# Patient Record
Sex: Female | Born: 1970 | Race: White | Hispanic: No | Smoking: Never smoker
Health system: Southern US, Community
[De-identification: ages and names within clinical notes are randomized; demographics above are authoritative.]

## PROBLEM LIST (undated history)

## (undated) DIAGNOSIS — U071 COVID-19: Secondary | ICD-10-CM

## (undated) DIAGNOSIS — B019 Varicella without complication: Secondary | ICD-10-CM

## (undated) DIAGNOSIS — E785 Hyperlipidemia, unspecified: Secondary | ICD-10-CM

## (undated) DIAGNOSIS — B977 Papillomavirus as the cause of diseases classified elsewhere: Secondary | ICD-10-CM

## (undated) DIAGNOSIS — G43909 Migraine, unspecified, not intractable, without status migrainosus: Secondary | ICD-10-CM

## (undated) DIAGNOSIS — R7303 Prediabetes: Secondary | ICD-10-CM

## (undated) DIAGNOSIS — I456 Pre-excitation syndrome: Secondary | ICD-10-CM

## (undated) HISTORY — DX: Hyperlipidemia, unspecified: E78.5

## (undated) HISTORY — DX: Varicella without complication: B01.9

## (undated) HISTORY — DX: Migraine, unspecified, not intractable, without status migrainosus: G43.909

## (undated) HISTORY — DX: Pre-excitation syndrome: I45.6

## (undated) HISTORY — DX: Prediabetes: R73.03

## (undated) HISTORY — DX: COVID-19: U07.1

## (undated) HISTORY — DX: Papillomavirus as the cause of diseases classified elsewhere: B97.7

---

## 2007-06-03 ENCOUNTER — Emergency Department (HOSPITAL_COMMUNITY): Admission: EM | Admit: 2007-06-03 | Discharge: 2007-06-03 | Payer: Self-pay | Admitting: Emergency Medicine

## 2007-06-09 ENCOUNTER — Emergency Department (HOSPITAL_COMMUNITY): Admission: EM | Admit: 2007-06-09 | Discharge: 2007-06-09 | Payer: Self-pay | Admitting: Family Medicine

## 2007-06-14 ENCOUNTER — Emergency Department (HOSPITAL_COMMUNITY): Admission: EM | Admit: 2007-06-14 | Discharge: 2007-06-14 | Payer: Self-pay | Admitting: Emergency Medicine

## 2007-11-29 ENCOUNTER — Emergency Department (HOSPITAL_COMMUNITY): Admission: EM | Admit: 2007-11-29 | Discharge: 2007-11-29 | Payer: Self-pay | Admitting: Emergency Medicine

## 2007-12-02 ENCOUNTER — Inpatient Hospital Stay (HOSPITAL_COMMUNITY): Admission: AD | Admit: 2007-12-02 | Discharge: 2007-12-03 | Payer: Self-pay | Admitting: Family Medicine

## 2007-12-02 ENCOUNTER — Emergency Department (HOSPITAL_COMMUNITY): Admission: EM | Admit: 2007-12-02 | Discharge: 2007-12-02 | Payer: Self-pay | Admitting: Emergency Medicine

## 2008-07-02 ENCOUNTER — Inpatient Hospital Stay (HOSPITAL_COMMUNITY): Admission: AD | Admit: 2008-07-02 | Discharge: 2008-07-04 | Payer: Self-pay | Admitting: Obstetrics and Gynecology

## 2009-03-10 ENCOUNTER — Emergency Department (HOSPITAL_COMMUNITY): Admission: EM | Admit: 2009-03-10 | Discharge: 2009-03-11 | Payer: Self-pay | Admitting: Emergency Medicine

## 2010-07-24 ENCOUNTER — Encounter: Admission: RE | Admit: 2010-07-24 | Discharge: 2010-07-24 | Payer: Self-pay | Admitting: Obstetrics and Gynecology

## 2011-01-13 LAB — RAPID STREP SCREEN (MED CTR MEBANE ONLY): Streptococcus, Group A Screen (Direct): NEGATIVE
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
## 2011-06-27 LAB — HCG, QUANTITATIVE, PREGNANCY
hCG, Beta Chain, Quant, S: 126830 — ABNORMAL HIGH
hCG, Beta Chain, Quant, S: 164325 — ABNORMAL HIGH

## 2011-06-27 LAB — WET PREP, GENITAL: Yeast Wet Prep HPF POC: NONE SEEN

## 2011-06-27 LAB — COMPREHENSIVE METABOLIC PANEL
ALT: 21
AST: 19
Albumin: 3.5
Alkaline Phosphatase: 42
BUN: 6
CO2: 24
Calcium: 9.1
Chloride: 100
Creatinine, Ser: 0.63
GFR calc Af Amer: >60
GFR calc non Af Amer: >60
Glucose, Bld: 97
Potassium: 3.3 — ABNORMAL LOW
Sodium: 134 — ABNORMAL LOW
Total Bilirubin: 0.8
Total Protein: 7.2

## 2011-06-27 LAB — POCT PREGNANCY, URINE
Operator id: 272551
Preg Test, Ur: POSITIVE

## 2011-06-27 LAB — URINALYSIS, ROUTINE W REFLEX MICROSCOPIC
Bilirubin Urine: NEGATIVE
Glucose, UA: NEGATIVE
Hgb urine dipstick: NEGATIVE
Ketones, ur: NEGATIVE
Nitrite: NEGATIVE
Protein, ur: 30 — AB
Specific Gravity, Urine: 1.025
Urobilinogen, UA: 0.2
pH: 6

## 2011-06-27 LAB — GC/CHLAMYDIA PROBE AMP, GENITAL
Chlamydia, DNA Probe: NEGATIVE
GC Probe Amp, Genital: NEGATIVE

## 2011-06-27 LAB — CBC
HCT: 34.6 — ABNORMAL LOW
Hemoglobin: 12
MCHC: 34.6
MCV: 85.4
Platelets: 283
RBC: 4.05
RDW: 13.9
WBC: 12.7 — ABNORMAL HIGH

## 2011-06-27 LAB — URINE MICROSCOPIC-ADD ON

## 2011-06-27 LAB — ABO/RH: ABO/RH(D): B POS

## 2011-06-27 LAB — URINE CULTURE: Colony Count: 35000

## 2011-07-07 LAB — CBC
HCT: 30.7 — ABNORMAL LOW
HCT: 35.2 — ABNORMAL LOW
Hemoglobin: 10.2 — ABNORMAL LOW
Hemoglobin: 11.7 — ABNORMAL LOW
MCHC: 33.2
MCHC: 33.4
MCV: 84.6
MCV: 84.9
Platelets: 204
Platelets: 237
RBC: 3.62 — ABNORMAL LOW
RBC: 4.16
RDW: 15.9 — ABNORMAL HIGH
RDW: 16.5 — ABNORMAL HIGH
WBC: 11.4 — ABNORMAL HIGH
WBC: 13 — ABNORMAL HIGH

## 2011-07-07 LAB — CCBB MATERNAL DONOR DRAW

## 2011-07-07 LAB — RPR: RPR Ser Ql: NONREACTIVE

## 2011-07-18 LAB — POCT URINALYSIS DIP (DEVICE)
Glucose, UA: NEGATIVE
Hgb urine dipstick: NEGATIVE
Nitrite: NEGATIVE
Operator id: 239701
Protein, ur: NEGATIVE
Specific Gravity, Urine: 1.02
Urobilinogen, UA: 0.2
pH: 6.5

## 2011-07-18 LAB — POCT PREGNANCY, URINE
Operator id: 239701
Preg Test, Ur: NEGATIVE

## 2011-07-18 LAB — GC/CHLAMYDIA PROBE AMP, GENITAL
Chlamydia, DNA Probe: NEGATIVE
GC Probe Amp, Genital: NEGATIVE

## 2011-07-18 LAB — WET PREP, GENITAL
Clue Cells Wet Prep HPF POC: NONE SEEN
Trich, Wet Prep: NONE SEEN

## 2011-09-04 ENCOUNTER — Other Ambulatory Visit: Payer: Self-pay | Admitting: Obstetrics and Gynecology

## 2011-09-04 DIAGNOSIS — M549 Dorsalgia, unspecified: Secondary | ICD-10-CM

## 2011-09-06 ENCOUNTER — Ambulatory Visit
Admission: RE | Admit: 2011-09-06 | Discharge: 2011-09-06 | Disposition: A | Discharge status: Home / Self Care | Payer: BC Managed Care – PPO | Source: Ambulatory Visit | Attending: Obstetrics and Gynecology | Admitting: Obstetrics and Gynecology

## 2011-09-06 DIAGNOSIS — M549 Dorsalgia, unspecified: Secondary | ICD-10-CM

## 2011-09-16 ENCOUNTER — Other Ambulatory Visit: Payer: Self-pay | Admitting: Orthopedic Surgery

## 2011-09-16 ENCOUNTER — Ambulatory Visit
Admission: RE | Admit: 2011-09-16 | Discharge: 2011-09-16 | Disposition: A | Discharge status: Home / Self Care | Payer: BC Managed Care – PPO | Source: Ambulatory Visit | Attending: Orthopedic Surgery | Admitting: Orthopedic Surgery

## 2011-09-16 DIAGNOSIS — M545 Low back pain, unspecified: Secondary | ICD-10-CM

## 2011-09-18 ENCOUNTER — Other Ambulatory Visit: Payer: Self-pay | Admitting: Orthopedic Surgery

## 2011-09-18 DIAGNOSIS — N289 Disorder of kidney and ureter, unspecified: Secondary | ICD-10-CM

## 2011-09-19 ENCOUNTER — Ambulatory Visit
Admission: RE | Admit: 2011-09-19 | Discharge: 2011-09-19 | Disposition: A | Discharge status: Home / Self Care | Payer: BC Managed Care – PPO | Source: Ambulatory Visit | Attending: Orthopedic Surgery | Admitting: Orthopedic Surgery

## 2011-09-19 DIAGNOSIS — N289 Disorder of kidney and ureter, unspecified: Secondary | ICD-10-CM

## 2011-09-19 MED ORDER — IOHEXOL 300 MG/ML  SOLN
125.0000 mL | Freq: Once | INTRAMUSCULAR | Status: AC | PRN
  Administered 2011-09-19: 125 mL via INTRAVENOUS

## 2012-02-04 ENCOUNTER — Telehealth: Payer: Self-pay | Admitting: Obstetrics and Gynecology

## 2012-02-04 NOTE — Telephone Encounter (Signed)
Routed to triage 

## 2012-02-05 NOTE — Telephone Encounter (Signed)
Lm on vm tcb rgd msg 

## 2012-02-09 NOTE — Telephone Encounter (Signed)
Spoke with pt rgd msg pt wants t

## 2012-02-09 NOTE — Telephone Encounter (Signed)
Spoke with pt rgd msg pt wants to discuss bc options pt has appt 02/24/12 at 845 with AVS pt voice understanding

## 2012-02-24 ENCOUNTER — Encounter: Payer: Self-pay | Admitting: Obstetrics and Gynecology

## 2012-03-03 ENCOUNTER — Encounter: Payer: Self-pay | Admitting: Obstetrics and Gynecology

## 2012-03-03 ENCOUNTER — Ambulatory Visit (INDEPENDENT_AMBULATORY_CARE_PROVIDER_SITE_OTHER): Payer: BC Managed Care – PPO | Admitting: Obstetrics and Gynecology

## 2012-03-03 VITALS — BP 108/80 | Resp 16 | Ht 63.0 in | Wt 237.0 lb

## 2012-03-03 DIAGNOSIS — N920 Excessive and frequent menstruation with regular cycle: Secondary | ICD-10-CM

## 2012-03-03 DIAGNOSIS — M549 Dorsalgia, unspecified: Secondary | ICD-10-CM

## 2012-03-03 DIAGNOSIS — E669 Obesity, unspecified: Secondary | ICD-10-CM

## 2012-03-03 DIAGNOSIS — Z309 Encounter for contraceptive management, unspecified: Secondary | ICD-10-CM

## 2012-03-03 DIAGNOSIS — N390 Urinary tract infection, site not specified: Secondary | ICD-10-CM

## 2012-03-03 DIAGNOSIS — IMO0001 Reserved for inherently not codable concepts without codable children: Secondary | ICD-10-CM

## 2012-03-03 LAB — POCT URINALYSIS DIPSTICK
Bilirubin, UA: NEGATIVE
Blood, UA: NEGATIVE
Glucose, UA: NEGATIVE
Ketones, UA: NEGATIVE
Nitrite, UA: NEGATIVE

## 2012-03-03 MED ORDER — NORETHINDRONE 0.35 MG PO TABS: 1.0000 | ORAL_TABLET | Freq: Every day | ORAL | Status: DC

## 2012-03-03 MED ORDER — SULFAMETHOXAZOLE-TMP DS 800-160 MG PO TABS: 1.0000 | ORAL_TABLET | Freq: Two times a day (BID) | ORAL | Status: AC

## 2012-03-03 NOTE — Progress Notes (Signed)
[name] is a 41 y.o. year old female,G2P1, who presents for a problem visit. The patient had her Mirena IUD removed because of back pain and strange neurologic symptoms that she thought was related to the Mirena.  Her back pain is somewhat better.  The neurologic symptoms continue.  Subjective:  The patient complains of urinary frequency.  She is interested in contraception that will help her periods not be so heavy.  Objective:  BP 108/80  Resp 16  Ht 5\' 3"  (1.6 m)  Wt 237 lb (107.502 kg)  BMI 41.98 kg/m2  LMP 02/19/2012   General: alert, cooperative and no distress GI: soft, non-tender; bowel sounds normal; no masses,  no organomegaly back: No CVA tenderness  pelvic exam declined  Urinalysis: 2+ leukocytes  Assessment:  Urinary frequency Contraception Obesity Heavy periods Improved back pain Neurologic symptoms of uncertain etiology  Plan:  Lungs discussion was held about contraceptive options.  The patient elects to begin oral contraceptives on a continuous basis.  Risk and benefits of contraception were reviewed.  Septra DS for urinary tract infection.  Return to office prn if symptoms worsen or fail to improve.   Leonard Schwartz M.D.  03/03/2012 9:18 AM

## 2012-03-04 ENCOUNTER — Other Ambulatory Visit: Payer: Self-pay | Admitting: Obstetrics and Gynecology

## 2012-03-04 NOTE — Telephone Encounter (Signed)
TC to pt.  regarding Rx.  Per pharmacy RX is available.

## 2012-03-04 NOTE — Telephone Encounter (Signed)
Triage/epic 

## 2012-09-15 ENCOUNTER — Other Ambulatory Visit: Payer: Self-pay | Admitting: Obstetrics and Gynecology

## 2012-09-15 DIAGNOSIS — Z1231 Encounter for screening mammogram for malignant neoplasm of breast: Secondary | ICD-10-CM

## 2012-10-07 ENCOUNTER — Ambulatory Visit
Admission: RE | Admit: 2012-10-07 | Discharge: 2012-10-07 | Disposition: A | Discharge status: Home / Self Care | Payer: BC Managed Care – PPO | Source: Ambulatory Visit | Attending: Obstetrics and Gynecology | Admitting: Obstetrics and Gynecology

## 2012-10-07 DIAGNOSIS — Z1231 Encounter for screening mammogram for malignant neoplasm of breast: Secondary | ICD-10-CM

## 2012-10-25 ENCOUNTER — Encounter: Payer: Self-pay | Admitting: Obstetrics and Gynecology

## 2012-10-25 ENCOUNTER — Ambulatory Visit: Payer: BC Managed Care – PPO | Admitting: Obstetrics and Gynecology

## 2012-10-25 VITALS — BP 110/70 | HR 76 | Resp 16 | Ht 63.0 in | Wt 247.0 lb

## 2012-10-25 DIAGNOSIS — Z01419 Encounter for gynecological examination (general) (routine) without abnormal findings: Secondary | ICD-10-CM

## 2012-10-25 DIAGNOSIS — N920 Excessive and frequent menstruation with regular cycle: Secondary | ICD-10-CM

## 2012-10-25 DIAGNOSIS — Z124 Encounter for screening for malignant neoplasm of cervix: Secondary | ICD-10-CM

## 2012-10-25 MED ORDER — NORETHINDRONE 0.35 MG PO TABS: 1.0000 | ORAL_TABLET | Freq: Every day | ORAL | Status: DC

## 2012-10-25 NOTE — Progress Notes (Signed)
Regular Periods: no "Every four 4 months" Mammogram: yes  Monthly Breast Ex.: no Exercise: yes  Tetanus < 10 years: no Seatbelts: yes  NI. Bladder Functn.: no "Frequency" No pain.  Abuse at home: no  Daily BM's: yes Stressful Work: yes  Healthy Diet: yes Sigmoid-Colonoscopy: N/A  Calcium: no Medical problems this year: None per pt    LAST PAP: 06/2011 "WNL"   Contraception: Camilla   Mammogram:  10/07/2012 "WNL"  PCP: No PCP  PMH: No Changes  FMH: No Changes  Last Bone Scan: No Changes

## 2012-10-25 NOTE — Progress Notes (Signed)
Subjective:    Candice Oconnor is a 42 y.o. female, G2P1, who presents for an annual exam. The patient reports no complaints.  Menstrual cycle:   LMP: none  on Camila              Review of Systems Pertinent items are noted in HPI. Denies pelvic pain, urinary tract symptoms, vaginitis symptoms, irregular bleeding, menopausal symptoms, change in bowel habits or rectal bleeding   Objective:    BP 110/70  Pulse 76  Resp 16  Ht 5\' 3"  (1.6 m)  Wt 247 lb (112.038 kg)  BMI 43.75 kg/m2  LMP 10/21/2012   Wt Readings from Last 1 Encounters:  03/03/12 237 lb (107.502 kg)   There is no height or weight on file to calculate BMI. General Appearance: Alert, no acute distress HEENT: Grossly normal Neck / Thyroid: Supple, no thyromegaly or cervical adenopathy Lungs: Clear to auscultation bilaterally Back: No CVA tenderness Breast Exam: No masses or nodes.No dimpling, nipple retraction or discharge. Cardiovascular: Regular rate and rhythm.  Gastrointestinal: Soft, non-tender, no masses or organomegaly Pelvic Exam: EGBUS-wnl, vagina-small blood, normal rugae, cervix- without lesions or tenderness, uterus appears normal size shape and consistency, exam limited by habitus, adnexae-no masses or tenderness Rectovaginal: sentinel tag  and normal sphincter tone Lymphatic Exam: Non-palpable nodes in neck, clavicular,  axillary, or inguinal regions  Skin: no rashes or abnormalities Extremities: no clubbing cyanosis or edema  Neurologic: grossly normal Psychiatric: Alert and oriented  Assessment:   Routine GYN Exam   Plan:  Camila  #1 1 po qd 11 refills  Now using [email or web address] for weight management, encouraged to continue   PAP sent  RTO 1 year or prn  Demiah Gullickson,ELMIRAPA-C

## 2012-10-26 LAB — PAP IG W/ RFLX HPV ASCU

## 2012-10-28 ENCOUNTER — Encounter: Payer: Self-pay | Admitting: Obstetrics and Gynecology

## 2013-03-16 ENCOUNTER — Encounter (HOSPITAL_BASED_OUTPATIENT_CLINIC_OR_DEPARTMENT_OTHER): Payer: Self-pay | Admitting: *Deleted

## 2013-03-16 ENCOUNTER — Emergency Department (HOSPITAL_BASED_OUTPATIENT_CLINIC_OR_DEPARTMENT_OTHER)
Admission: EM | Admit: 2013-03-16 | Discharge: 2013-03-16 | Disposition: A | Discharge status: Home / Self Care | Payer: BC Managed Care – PPO | Attending: Emergency Medicine | Admitting: Emergency Medicine

## 2013-03-16 ENCOUNTER — Emergency Department (HOSPITAL_BASED_OUTPATIENT_CLINIC_OR_DEPARTMENT_OTHER): Payer: BC Managed Care – PPO

## 2013-03-16 DIAGNOSIS — M5430 Sciatica, unspecified side: Secondary | ICD-10-CM | POA: Insufficient documentation

## 2013-03-16 DIAGNOSIS — K08109 Complete loss of teeth, unspecified cause, unspecified class: Secondary | ICD-10-CM | POA: Insufficient documentation

## 2013-03-16 DIAGNOSIS — Z79899 Other long term (current) drug therapy: Secondary | ICD-10-CM | POA: Insufficient documentation

## 2013-03-16 DIAGNOSIS — Z8619 Personal history of other infectious and parasitic diseases: Secondary | ICD-10-CM | POA: Insufficient documentation

## 2013-03-16 DIAGNOSIS — Z87440 Personal history of urinary (tract) infections: Secondary | ICD-10-CM | POA: Insufficient documentation

## 2013-03-16 DIAGNOSIS — R209 Unspecified disturbances of skin sensation: Secondary | ICD-10-CM | POA: Insufficient documentation

## 2013-03-16 DIAGNOSIS — M5432 Sciatica, left side: Secondary | ICD-10-CM

## 2013-03-16 DIAGNOSIS — M25559 Pain in unspecified hip: Secondary | ICD-10-CM | POA: Insufficient documentation

## 2013-03-16 MED ORDER — CYCLOBENZAPRINE HCL 5 MG PO TABS: 5.0000 mg | ORAL_TABLET | Freq: Three times a day (TID) | ORAL | Status: DC | PRN

## 2013-03-16 MED ORDER — IBUPROFEN 800 MG PO TABS: 800.0000 mg | ORAL_TABLET | Freq: Three times a day (TID) | ORAL | Status: DC

## 2013-03-16 MED ORDER — IBUPROFEN 800 MG PO TABS
800.0000 mg | ORAL_TABLET | Freq: Once | ORAL | Status: AC
  Administered 2013-03-16: 800 mg via ORAL
  Filled 2013-03-16: qty 1

## 2013-03-16 MED ORDER — OXYCODONE-ACETAMINOPHEN 5-325 MG PO TABS: 1.0000 | ORAL_TABLET | Freq: Four times a day (QID) | ORAL | Status: DC | PRN

## 2013-03-16 NOTE — ED Notes (Signed)
Pt reports  Sudden onset of left hip pain awakening her from sleep last night. Denies any injury or trauma, states "it's almost like a sciatic nerve kind of thing..." pt reports intermittent numbness that radiates from her hip to her foot on the left.

## 2013-03-16 NOTE — ED Provider Notes (Signed)
History     CSN: [id number]  Arrival date & time 03/16/13  4098   First MD Initiated Contact with Patient 03/16/13 0720      Chief Complaint  Patient presents with  . Leg Pain    (Consider location/radiation/quality/duration/timing/severity/associated sxs/prior treatment) HPI Pt presenting with pain in posterior of left hip and buttock with radiation down left leg, also some numbness of left leg depending on what position she is in.  Pain worse with movement and palpation.  No weakness of leg, no urinary retention or incontinence of bowel or bladder.  No fever.  No new trauma.  Pain began last night while moving around in bed.  She has not tried anything for her symptoms prior to arrival.  No new activities.  There are no other associated systemic symptoms, there are no other alleviating or modifying factors.   Past Medical History  Diagnosis Date  . Low back pain   . H/O urinary tract infection   . HPV in female   . Headache(784.0)   . Mumps   . Chicken pox   . H/O wisdom tooth extraction     History reviewed. No pertinent past surgical history.  Family History  Problem Relation Age of Onset  . Heart disease Mother   . Heart disease Father     History  Substance Use Topics  . Smoking status: Never Smoker   . Smokeless tobacco: Never Used  . Alcohol Use: No    OB History   Grav Para Term Preterm Abortions TAB SAB Ect Mult Living   2 1              Review of Systems ROS reviewed and all otherwise negative except for mentioned in HPI  Allergies  Review of patient's allergies indicates no known allergies.  Home Medications   Current Outpatient Rx  Name  Route  Sig  Dispense  Refill  . cetirizine (ZYRTEC) 10 MG tablet   Oral   Take 10 mg by mouth daily.         . citalopram (CELEXA) 10 MG tablet   Oral   Take 10 mg by mouth daily.         . propantheline (PROBANTHINE) 15 MG tablet   Oral   Take 10 mg by mouth every morning.         . traZODone  (DESYREL) 50 MG tablet   Oral   Take 50 mg by mouth at bedtime.         . cyclobenzaprine (FLEXERIL) 5 MG tablet   Oral   Take 1 tablet (5 mg total) by mouth 3 (three) times daily as needed for muscle spasms.   20 tablet   0   . famotidine (PEPCID) 20 MG tablet   Oral   Take 20 mg by mouth 2 (two) times daily.         Marland Kitchen ibuprofen (ADVIL,MOTRIN) 200 MG tablet   Oral   Take 200 mg by mouth every 6 (six) hours as needed.         Marland Kitchen ibuprofen (ADVIL,MOTRIN) 800 MG tablet   Oral   Take 1 tablet (800 mg total) by mouth 3 (three) times daily.   21 tablet   0   . levonorgestrel (MIRENA) 20 MCG/24HR IUD   Intrauterine   1 each by Intrauterine route once.         . norethindrone (ORTHO MICRONOR) 0.35 MG tablet   Oral   Take 1 tablet (0.35  mg total) by mouth daily.   1 Package   11   . oxyCODONE-acetaminophen (PERCOCET/ROXICET) 5-325 MG per tablet   Oral   Take 1-2 tablets by mouth every 6 (six) hours as needed for pain.   15 tablet   0     BP 128/82  Pulse 82  Temp(Src) 97.9 F (36.6 C) (Oral)  Resp 18  Ht 5\' 3"  (1.6 m)  Wt 250 lb (113.399 kg)  BMI 44.3 kg/m2  SpO2 100% Vitals reviewed Physical Exam Physical Examination: General appearance - alert, well appearing, and in no distress Mental status - alert, oriented to person, place, and time Eyes - no scleral icterus Neck - supple, no significant adenopathy, no midline tenderness to palpation Chest - clear to auscultation, no wheezes, rales or rhonchi, symmetric air entry Heart - normal rate, regular rhythm, normal S1, S2, no murmurs, rubs, clicks or gallops Back exam - no midline tenderness to palpation for cervical, thoracic, lumbar regions, no CVA tenderness, ttp over sciatic notch of left buttock Neurological - alert, oriented, normal speech, strength 5/5 in extremities, sensation intact in extremiteis x 4 Musculoskeletal - no joint tenderness, deformity or swelling, some pain with external rotation of  left hip Extremities - peripheral pulses normal, no pedal edema, no clubbing or cyanosis Skin - normal coloration and turgor, no rashes  ED Course  Procedures (including critical care time)  Labs Reviewed - No data to display Dg Hip Complete Left  03/16/2013   *RADIOLOGY REPORT*  Clinical Data: Left hip and leg pain, no trauma  LEFT HIP - COMPLETE 2+ VIEW  Comparison: None.  Findings: The hip joint spaces are relatively normal for age.  No acute bony abnormality is seen.  The pelvic rami are intact, and the SI joints appear normal.  IMPRESSION: Negative.   Original Report Authenticated By: Dwyane Dee, M.D.     1. Sciatica, left       MDM  Pt presenting with pain in posterior hip and buttock with pain going down left leg and some numbness c/w sciatica.  No signs or symptoms of cauda equina, epidural abscess or other acute emergent problem at this time.  xrays reassuring.  All results d/w patient at the bedside. Discharged with strict return precautions.  Pt agreeable with plan.        Ethelda Chick, MD 03/16/13 [phone]

## 2014-03-08 ENCOUNTER — Other Ambulatory Visit: Payer: Self-pay | Admitting: Pain Medicine

## 2014-03-08 DIAGNOSIS — M542 Cervicalgia: Secondary | ICD-10-CM

## 2014-03-14 ENCOUNTER — Other Ambulatory Visit: Payer: BC Managed Care – PPO

## 2014-06-21 ENCOUNTER — Ambulatory Visit (INDEPENDENT_AMBULATORY_CARE_PROVIDER_SITE_OTHER): Payer: BC Managed Care – PPO

## 2014-06-21 VITALS — BP 143/86 | HR 89 | Resp 14 | Ht 63.0 in | Wt 265.0 lb

## 2014-06-21 DIAGNOSIS — S99921A Unspecified injury of right foot, initial encounter: Secondary | ICD-10-CM

## 2014-06-21 DIAGNOSIS — S99929A Unspecified injury of unspecified foot, initial encounter: Secondary | ICD-10-CM

## 2014-06-21 DIAGNOSIS — S99919A Unspecified injury of unspecified ankle, initial encounter: Secondary | ICD-10-CM

## 2014-06-21 DIAGNOSIS — S8990XA Unspecified injury of unspecified lower leg, initial encounter: Secondary | ICD-10-CM

## 2014-06-21 DIAGNOSIS — M722 Plantar fascial fibromatosis: Secondary | ICD-10-CM

## 2014-06-21 DIAGNOSIS — M84376A Stress fracture, unspecified foot, initial encounter for fracture: Secondary | ICD-10-CM

## 2014-06-21 DIAGNOSIS — M79609 Pain in unspecified limb: Secondary | ICD-10-CM

## 2014-06-21 DIAGNOSIS — M79671 Pain in right foot: Secondary | ICD-10-CM

## 2014-06-21 NOTE — Progress Notes (Signed)
   Subjective:    Patient ID: Candice Oconnor, female    DOB: 1970/10/21, 43 y.o.   MRN: [id number]  HPI Comments: N foot injury L right dorsi-midfoot D yesterday O casual walking C pt states she heard a pop, now has dull pain constant and shooting throbbing pain while walking A constant and sharp with walking T pt is currently on Flector patch for back pain  Foot Pain Associated symptoms include arthralgias and headaches.      Review of Systems  Gastrointestinal:       Nausea and stomach pain with NSAIDS  Musculoskeletal: Positive for arthralgias, back pain and gait problem.  Neurological: Positive for headaches.  All other systems reviewed and are negative.      Objective:   Physical Exam 43 year old female well-developed well-nourished severely overweight oriented x3 presents at this time with an injury to her right foot she has been wearing shoes are somewhat worn need replacing however while walking and they hurt it popped and since that time had pain in her right foot dorsal foot pointing along the second metatarsal third metatarsal midshaft area. There is mild edema noted although patient has general edema both lower extremities. Her extremity objective findings vascular status is intact pedal pulses palpable DP postal for PT posterior were for mild edema neurologically epicritic and proprioceptive sensations intact and symmetric bilateral there is normal plantar response and DTRs. Dermatological skin color pigment normal hair growth absent nails unremarkable orthopedic exam there is no pain on palpation of the left foot good range of motion to flexion all digits there is tenderness in the plantar fascia and mid arch. Right foot however has plantar fascial pain however most significant is pain on palpation over the second and third metatarsal area dorsally pain and vibratory sensation testing over the second met most likely midshaft area. X-rays reveal no acute displacement or  fracture however cannot rule out in nondisplaced stress fracture of the second metatarsal right foot. Remainder of exam unremarkable except for the fasciitis       Assessment & Plan:  Assessment this time mild plantar fasciitis bilateral or acute stress fracture second metatarsal right foot with edema pain tenderness discomfort patient's oriented flexor patch for her back we use Tylenol for a digital breakthrough pain also recommended warm compress ice pack to the area patient placed in the air fracture boot at this time for mobilization to maintain boot for compression mobilization of the right foot he removed for driving only and then resume the use keep it on at all times as a cast immobilizer. Recheck in 3 or 4 weeks for reevaluation and followup x-rays to assess healing and progress next  Harriet Masson DPM

## 2014-06-21 NOTE — Patient Instructions (Signed)
Metatarsal Stress Fracture When too much stress is put on the foot, as in running and jumping sports, the center shaft of the bones of the forefoot is very susceptible to stress fractures (break in bone). This is because of repetitive stress on the bone. This injury is more common if osteoporosis is present or if inadequate running shoes are used. Rapid increase in running distances are often the cause. Running distances should be gradually increased to avoid this problem. Shoes should be used which adequately cushion the foot. Shoes should absorb the shocks of the activity.  DIAGNOSIS  Usually the diagnosis is made by history. The foot progressively becomes sorer with activities. X-rays may be negative (show no break) within the first 2 to 3 weeks of the beginning of pain. A later X-ray may show signs of healing bone (callus formation). A bone scan or MRI will usually make the diagnosis earlier. TREATMENT AND HOME CARE INSTRUCTIONS  Treatment may or may not include a cast, removable fracture boot, or walking shoe. Casts are used for short periods of time to prevent muscle atrophy (muscle wasting).  Activities should be stopped until further advised by your caregiver.  Wear shoes with adequate shock absorbing abilities.  Alternative exercise may be undertaken while waiting for healing. These may include bicycling and swimming, or as your caregiver suggests. If you do not have a cast or splint:  You may walk on your injured foot as tolerated or advised.  Do not put any weight on your injured foot for as long as directed by your caregiver. Slowly increase the amount of time you walk on the foot as the pain allows or as advised.  Use crutches until you can bear weight without pain. A gradual increase in weight bearing may help.  Apply ice to the injury for 15-20 minutes each hour while awake for the first 2 days. Put the ice in a plastic bag and place a towel between the bag of ice and your  skin.  Only take over-the-counter or prescription medicines for pain, discomfort, or fever as directed by your caregiver. SEEK IMMEDIATE MEDICAL CARE IF:   Pain is becoming worse rather than better, or if pain is uncontrolled with medications.  You have increased swelling or redness in the foot. MAKE SURE YOU:   Understand these instructions.  Will watch your condition.  Will get help right away if you are not doing well or get worse. Document Released: 09/19/2000 Document Revised: 12/15/2011 Document Reviewed: 07/18/2008 Exit[employer] Patient Information 2015 [employer]. This information is not intended to replace advice given to you by your health care provider. Make sure you discuss any questions you have with your health care provider.  

## 2014-07-12 ENCOUNTER — Telehealth: Payer: Self-pay | Admitting: *Deleted

## 2014-07-12 NOTE — Telephone Encounter (Signed)
I see Dr. Blenda Mounts.  I have water physical therapy scheduled for arthritis in my back but I have a hairline fracture in the 2nd metatarsal bone in my foot.  I'm just calling to make sure that's okay for me to be walking around in the pool.  Thank you.

## 2014-07-13 NOTE — Telephone Encounter (Signed)
I called and left her a message that it is okay to do the water exercise per Dr. Blenda Mounts.  It said it may actually be beneficial for bone healing.  Wear the boot/shoe until you get in the water.  Wear a pool shoe or water type shoe while in the pool.  Call if you have any further questions.

## 2014-07-13 NOTE — Telephone Encounter (Signed)
Walking in the pool is not as much stress as walking on Land.. maintain the boot her shoe until you get in the water once the water you are weight lists or less weight or pressure on your foot as long as you're in more than 4 feet of water. We suggest using a pool shoe or water type shoes made beneficial as well during water exercise activity. Actually the water activity may be beneficial to the fracture healing next  Harriet Masson DPM

## 2014-07-18 ENCOUNTER — Ambulatory Visit: Payer: BC Managed Care – PPO

## 2014-08-01 ENCOUNTER — Ambulatory Visit: Payer: BC Managed Care – PPO

## 2014-09-14 ENCOUNTER — Ambulatory Visit
Admission: RE | Admit: 2014-09-14 | Discharge: 2014-09-14 | Disposition: A | Discharge status: Home / Self Care | Payer: BC Managed Care – PPO | Source: Ambulatory Visit | Attending: Physician Assistant | Admitting: Physician Assistant

## 2014-09-14 ENCOUNTER — Other Ambulatory Visit: Payer: Self-pay | Admitting: Physician Assistant

## 2014-09-14 DIAGNOSIS — R112 Nausea with vomiting, unspecified: Secondary | ICD-10-CM

## 2014-09-14 DIAGNOSIS — R1084 Generalized abdominal pain: Secondary | ICD-10-CM

## 2014-09-14 DIAGNOSIS — R140 Abdominal distension (gaseous): Secondary | ICD-10-CM

## 2014-09-14 DIAGNOSIS — D72829 Elevated white blood cell count, unspecified: Secondary | ICD-10-CM

## 2014-09-15 ENCOUNTER — Emergency Department (HOSPITAL_COMMUNITY)
Admission: EM | Admit: 2014-09-15 | Discharge: 2014-09-15 | Disposition: A | Discharge status: Home / Self Care | Payer: BC Managed Care – PPO | Attending: Emergency Medicine | Admitting: Emergency Medicine

## 2014-09-15 ENCOUNTER — Encounter (HOSPITAL_COMMUNITY): Payer: Self-pay | Admitting: *Deleted

## 2014-09-15 ENCOUNTER — Emergency Department (HOSPITAL_COMMUNITY): Payer: BC Managed Care – PPO

## 2014-09-15 DIAGNOSIS — R1032 Left lower quadrant pain: Secondary | ICD-10-CM | POA: Insufficient documentation

## 2014-09-15 DIAGNOSIS — Z791 Long term (current) use of non-steroidal anti-inflammatories (NSAID): Secondary | ICD-10-CM | POA: Insufficient documentation

## 2014-09-15 DIAGNOSIS — R109 Unspecified abdominal pain: Secondary | ICD-10-CM

## 2014-09-15 DIAGNOSIS — R112 Nausea with vomiting, unspecified: Secondary | ICD-10-CM | POA: Diagnosis not present

## 2014-09-15 DIAGNOSIS — R1013 Epigastric pain: Secondary | ICD-10-CM | POA: Diagnosis not present

## 2014-09-15 DIAGNOSIS — Z8619 Personal history of other infectious and parasitic diseases: Secondary | ICD-10-CM | POA: Insufficient documentation

## 2014-09-15 DIAGNOSIS — Z8739 Personal history of other diseases of the musculoskeletal system and connective tissue: Secondary | ICD-10-CM | POA: Diagnosis not present

## 2014-09-15 DIAGNOSIS — R102 Pelvic and perineal pain: Secondary | ICD-10-CM | POA: Diagnosis not present

## 2014-09-15 DIAGNOSIS — R1012 Left upper quadrant pain: Secondary | ICD-10-CM | POA: Diagnosis not present

## 2014-09-15 DIAGNOSIS — Z87440 Personal history of urinary (tract) infections: Secondary | ICD-10-CM | POA: Insufficient documentation

## 2014-09-15 DIAGNOSIS — Z8719 Personal history of other diseases of the digestive system: Secondary | ICD-10-CM | POA: Insufficient documentation

## 2014-09-15 DIAGNOSIS — Z79899 Other long term (current) drug therapy: Secondary | ICD-10-CM | POA: Insufficient documentation

## 2014-09-15 DIAGNOSIS — R197 Diarrhea, unspecified: Secondary | ICD-10-CM | POA: Insufficient documentation

## 2014-09-15 DIAGNOSIS — R110 Nausea: Secondary | ICD-10-CM

## 2014-09-15 DIAGNOSIS — E669 Obesity, unspecified: Secondary | ICD-10-CM | POA: Insufficient documentation

## 2014-09-15 LAB — I-STAT BETA HCG BLOOD, ED (MC, WL, AP ONLY)

## 2014-09-15 LAB — COMPREHENSIVE METABOLIC PANEL
ALBUMIN: 3.7 g/dL (ref 3.5–5.2)
ALK PHOS: 78 U/L (ref 39–117)
ALT: 15 U/L (ref 0–35)
ANION GAP: 15 (ref 5–15)
AST: 13 U/L (ref 0–37)
BUN: 8 mg/dL (ref 6–23)
CALCIUM: 9.4 mg/dL (ref 8.4–10.5)
CO2: 23 mEq/L (ref 19–32)
Chloride: 102 mEq/L (ref 96–112)
Creatinine, Ser: 0.68 mg/dL (ref 0.50–1.10)
GFR calc non Af Amer: >90 mL/min (ref >90)
Glucose, Bld: 101 mg/dL — ABNORMAL HIGH (ref 70–99)
POTASSIUM: 4.2 meq/L (ref 3.7–5.3)
SODIUM: 140 meq/L (ref 137–147)
TOTAL PROTEIN: 7.7 g/dL (ref 6.0–8.3)
Total Bilirubin: 0.2 mg/dL — ABNORMAL LOW (ref 0.3–1.2)

## 2014-09-15 LAB — CBC WITH DIFFERENTIAL/PLATELET
BASOS ABS: 0 10*3/uL (ref 0.0–0.1)
Basophils Relative: 0 % (ref 0–1)
Eosinophils Absolute: 0.4 10*3/uL (ref 0.0–0.7)
Eosinophils Relative: 4 % (ref 0–5)
HCT: 38.7 % (ref 36.0–46.0)
Hemoglobin: 12.8 g/dL (ref 12.0–15.0)
LYMPHS ABS: 1.5 10*3/uL (ref 0.7–4.0)
LYMPHS PCT: 15 % (ref 12–46)
MCH: 27.8 pg (ref 26.0–34.0)
MCHC: 33.1 g/dL (ref 30.0–36.0)
MCV: 83.9 fL (ref 78.0–100.0)
Monocytes Absolute: 0.6 10*3/uL (ref 0.1–1.0)
Monocytes Relative: 6 % (ref 3–12)
NEUTROS ABS: 7.8 10*3/uL — AB (ref 1.7–7.7)
NEUTROS PCT: 75 % (ref 43–77)
PLATELETS: 266 10*3/uL (ref 150–400)
RBC: 4.61 MIL/uL (ref 3.87–5.11)
RDW: 13.8 % (ref 11.5–15.5)
WBC: 10.3 10*3/uL (ref 4.0–10.5)

## 2014-09-15 LAB — URINE MICROSCOPIC-ADD ON

## 2014-09-15 LAB — URINALYSIS, ROUTINE W REFLEX MICROSCOPIC
Bilirubin Urine: NEGATIVE
GLUCOSE, UA: NEGATIVE mg/dL
Hgb urine dipstick: NEGATIVE
Ketones, ur: NEGATIVE mg/dL
NITRITE: NEGATIVE
PH: 6.5 (ref 5.0–8.0)
Protein, ur: NEGATIVE mg/dL
SPECIFIC GRAVITY, URINE: 1.016 (ref 1.005–1.030)
Urobilinogen, UA: 0.2 mg/dL (ref 0.0–1.0)

## 2014-09-15 LAB — LIPASE, BLOOD: Lipase: 33 U/L (ref 11–59)

## 2014-09-15 MED ORDER — MORPHINE SULFATE 4 MG/ML IJ SOLN
4.0000 mg | Freq: Once | INTRAMUSCULAR | Status: AC
  Administered 2014-09-15: 4 mg via INTRAVENOUS
  Filled 2014-09-15: qty 1

## 2014-09-15 MED ORDER — IOHEXOL 300 MG/ML  SOLN
100.0000 mL | Freq: Once | INTRAMUSCULAR | Status: AC | PRN
  Administered 2014-09-15: 100 mL via INTRAVENOUS

## 2014-09-15 MED ORDER — PROMETHAZINE HCL 25 MG/ML IJ SOLN
12.5000 mg | Freq: Once | INTRAMUSCULAR | Status: AC
  Administered 2014-09-15: 12.5 mg via INTRAVENOUS
  Filled 2014-09-15: qty 1

## 2014-09-15 MED ORDER — HYDROCODONE-ACETAMINOPHEN 5-325 MG PO TABS: 1.0000 | ORAL_TABLET | Freq: Four times a day (QID) | ORAL | Status: DC | PRN

## 2014-09-15 MED ORDER — SODIUM CHLORIDE 0.9 % IV BOLUS (SEPSIS)
1000.0000 mL | Freq: Once | INTRAVENOUS | Status: AC
  Administered 2014-09-15: 1000 mL via INTRAVENOUS

## 2014-09-15 MED ORDER — ONDANSETRON HCL 4 MG PO TABS: 4.0000 mg | ORAL_TABLET | Freq: Three times a day (TID) | ORAL | Status: DC | PRN

## 2014-09-15 MED ORDER — ONDANSETRON HCL 4 MG/2ML IJ SOLN
4.0000 mg | Freq: Once | INTRAMUSCULAR | Status: AC
  Administered 2014-09-15: 4 mg via INTRAVENOUS
  Filled 2014-09-15: qty 2

## 2014-09-15 MED ORDER — IOHEXOL 300 MG/ML  SOLN
25.0000 mL | INTRAMUSCULAR | Status: DC | PRN
  Administered 2014-09-15: 25 mL via ORAL
  Filled 2014-09-15: qty 30

## 2014-09-15 NOTE — Discharge Instructions (Signed)
Your labs and CT are reassuring, no explanation for your symptoms on CT exam. Follow-up with your GI specialist, gynecologist for further evaluation of your ongoing symptoms. Drink plenty of fluids. Return if symptoms worsen.

## 2014-09-15 NOTE — ED Notes (Signed)
Pt to ED c/o increased abdominal pain, diarrhea and nausea, despite being on phenergan.  Pt has been seen by her OBGYN and GI for initial pelvic pain.  Pain now has "moved up" to abdomen, esp L side.  Pt states emesis x 2 yesterday and diarrhea x 2 today.

## 2014-09-15 NOTE — ED Provider Notes (Signed)
43 year old female, recently worked up by gynecology with pelvic ultrasound, negative, worked up by gastroenterology for H. pylori, CT scan ordered for later next week, presents with increased pain in the left upper quadrant and epigastrium, ongoing episodes of vomiting. Has been prescribed Phenergan and a pain medication by the gynecologist. On exam the patient has tenderness in the epigastrium and left upper quadrant, no guarding, very soft abdomen, no peritoneal signs, no tachycardia and normal lung sounds. We'll proceed with CT scan, labs are unremarkable including no leukocytosis or liver function abnormalities, normal lipase. Patient is in agreement with the plan. Anticipate treatment for her symptoms consistent with IBS if workup negative.  Medical screening examination/treatment/procedure(s) were conducted as a shared visit with non-physician practitioner(s) and myself.  I personally evaluated the patient during the encounter.  Clinical Impression:   Final diagnoses:  Epigastric abdominal pain  Abdominal discomfort  Nausea in adult  Diarrhea         Johnna Acosta, MD 09/16/14 [phone]

## 2014-09-15 NOTE — ED Notes (Signed)
Pt transported to CT ?

## 2014-09-15 NOTE — ED Provider Notes (Signed)
CSN: [id number]     Arrival date & time 09/15/14  4268 History   First MD Initiated Contact with Patient 09/15/14 0750     No chief complaint on file.    (Consider location/radiation/quality/duration/timing/severity/associated sxs/prior Treatment) HPI Comments: The patient is a 43 year old female with past medical history of low back pain, obesity, presenting to emergency room chief complaint of abdominal discomfort for several weeks. Patient reports initial discomfort as pelvic pain, seen by gyncologist with increase in discomfort after pelvic exam. Negative ultrasound. Patient reports increase in epigastric and left-sided abdominal discomfort over the last several days. She reports 2 episodes of vomiting the last 24 hours, unrelieved by phenergan. She reports intermittent diarrhea over the last 3 weeks. Patient's last menstrual period was 08/24/2014. Reports irregular menstrual periods over the past several months, reports spotting.  GI: [facility]  The history is provided by the patient. No language interpreter was used.    Past Medical History  Diagnosis Date  . Low back pain   . H/O urinary tract infection   . HPV in female   . Headache(784.0)   . Mumps   . Chicken pox   . H/O wisdom tooth extraction    No past surgical history on file. Family History  Problem Relation Age of Onset  . Heart disease Mother   . Heart disease Father    History  Substance Use Topics  . Smoking status: Never Smoker   . Smokeless tobacco: Never Used  . Alcohol Use: No   OB History    Gravida Para Term Preterm AB TAB SAB Ectopic Multiple Living   2 1             Review of Systems  Constitutional: Negative for fever and chills.  Gastrointestinal: Positive for nausea, vomiting, abdominal pain and diarrhea. Negative for constipation, blood in stool and anal bleeding.  Genitourinary: Positive for menstrual problem and pelvic pain. Negative for dysuria, urgency, hematuria and vaginal discharge.       Allergies  Other  Home Medications   Prior to Admission medications   Medication Sig Start Date End Date Taking? Authorizing Provider  citalopram (CELEXA) 10 MG tablet Take 40 mg by mouth daily.     Historical Provider, MD  diclofenac (FLECTOR) 1.3 % PTCH Place 1 patch onto the skin 2 (two) times daily.    Historical Provider, MD  famotidine (PEPCID) 20 MG tablet Take 20 mg by mouth 2 (two) times daily.    Historical Provider, MD  norethindrone (ORTHO MICRONOR) 0.35 MG tablet Take 1 tablet (0.35 mg total) by mouth daily. 10/25/12 10/25/13  Earnstine Regal, PA-C  propranolol (INDERAL) 40 MG tablet Take 40 mg by mouth at bedtime.    Historical Provider, MD  traZODone (DESYREL) 50 MG tablet Take 50 mg by mouth at bedtime.    Historical Provider, MD   There were no vitals taken for this visit. Physical Exam  Constitutional: She is oriented to person, place, and time. She appears well-developed and well-nourished. No distress.  Obese female  HENT:  Head: Normocephalic and atraumatic.  Neck: Neck supple.  Cardiovascular: Normal rate and regular rhythm.   Pulmonary/Chest: Effort normal. No respiratory distress.  Abdominal: Soft. She exhibits no distension. There is tenderness in the epigastric area, left upper quadrant and left lower quadrant. There is no rebound, no guarding and no CVA tenderness.  Neurological: She is alert and oriented to person, place, and time.  Skin: Skin is warm and dry. She is not diaphoretic.  Psychiatric: She has a normal mood and affect. Her behavior is normal.  Nursing note and vitals reviewed.   ED Course  Procedures (including critical care time) Labs Review Labs Reviewed  CBC WITH DIFFERENTIAL - Abnormal; Notable for the following:    Neutro Abs 7.8 (*)    All other components within normal limits  URINALYSIS, ROUTINE W REFLEX MICROSCOPIC - Abnormal; Notable for the following:    APPearance CLOUDY (*)    Leukocytes, UA TRACE (*)    All other  components within normal limits  COMPREHENSIVE METABOLIC PANEL - Abnormal; Notable for the following:    Glucose, Bld 101 (*)    Total Bilirubin 0.2 (*)    All other components within normal limits  URINE MICROSCOPIC-ADD ON - Abnormal; Notable for the following:    Squamous Epithelial / LPF MANY (*)    Bacteria, UA MANY (*)    All other components within normal limits  LIPASE, BLOOD  I-STAT BETA HCG BLOOD, ED ([facility], [facility], [facility] ONLY)    Imaging Review Dg Abd 1 View  09/14/2014   CLINICAL DATA:  Pain.  Nausea.  EXAM: ABDOMEN - 1 VIEW  COMPARISON:  CT 09/19/2011.  FINDINGS: Soft tissues unremarkable. The gas pattern is nonspecific. Pelvic calcifications noted consistent phleboliths. No acute bony abnormality.  IMPRESSION: No acute abnormality identified.  No bowel distention.   Electronically Signed   By: Marcello Moores  Register   On: 09/14/2014 10:38   Ct Abdomen Pelvis W Contrast  09/15/2014   CLINICAL DATA:  Epigastric abdominal pain.  EXAM: CT ABDOMEN AND PELVIS WITH CONTRAST  TECHNIQUE: Multidetector CT imaging of the abdomen and pelvis was performed using the standard protocol following bolus administration of intravenous contrast.  CONTRAST:  170mL OMNIPAQUE IOHEXOL 300 MG/ML  SOLN  COMPARISON:  CT scan of September 19, 2011.  FINDINGS: Visualized lung bases appear normal. No significant osseous abnormality is noted.  No gallstones are noted. The liver, spleen and pancreas appear normal. Adrenal glands appear normal. Small cyst is seen in midpole of right kidney. No hydronephrosis or renal obstruction is noted. No renal or ureteral calculi are noted. There is no evidence of bowel obstruction. The appendix appears normal. Uterus and ovaries appear normal. Urinary bladder appears normal. No significant adenopathy is noted. No abnormal fluid collection is noted.  IMPRESSION: No acute abnormality seen in the abdomen or pelvis.   Electronically Signed   By: Sabino Dick M.D.   On: 09/15/2014 10:53     EKG  Interpretation None      MDM   Final diagnoses:  Epigastric abdominal pain  Abdominal discomfort  Nausea in adult  Diarrhea   Patient presents with epigastric and generalized abdominal pain, currently being evaluated by GI as outpatient and persistent pelvic pain also evaluated by gynecologist outpatient. Patient has had a negative upright abdomen yesterday, negative ultrasound of pelvis in the past several weeks. Patient also claims of intermittent diarrhea and recently nausea with 2 episodes of emesis today. Pt afebrile in NAD, no periretinal signs.  CBC, CBC  Without concerning on mount use. UA shows not a clean catch. Negative pregnancy. CT were negative findings. Reevaluation patient resting complain room reports mild resolution of symptoms with pain medication and nausea medication. Discussed lab results, imaging results, and treatment plan with the patient. Return precautions given. Reports understanding and no other concerns at this time.  Patient is stable for discharge at this time. Meds given in ED:  Medications  sodium chloride 0.9 %  bolus 1,000 mL (0 mLs Intravenous Stopped 09/15/14 1135)  ondansetron (ZOFRAN) injection 4 mg (4 mg Intravenous Given 09/15/14 0906)  morphine 4 MG/ML injection 4 mg (4 mg Intravenous Given 09/15/14 0906)  iohexol (OMNIPAQUE) 300 MG/ML solution 100 mL (100 mLs Intravenous Contrast Given 09/15/14 1017)  promethazine (PHENERGAN) injection 12.5 mg (12.5 mg Intravenous Given 09/15/14 1129)  morphine 4 MG/ML injection 4 mg (4 mg Intravenous Given 09/15/14 1132)    Discharge Medication List as of 09/15/2014 11:11 AM    START taking these medications   Details  HYDROcodone-acetaminophen (NORCO/VICODIN) 5-325 MG per tablet Take 1 tablet by mouth every 6 (six) hours as needed for moderate pain or severe pain., Starting 09/15/2014, Until Discontinued, Print    ondansetron (ZOFRAN) 4 MG tablet Take 1 tablet (4 mg total) by mouth every 8 (eight) hours  as needed for nausea or vomiting., Starting 09/15/2014, Until Discontinued, Print         Harvie Heck, PA-C 09/15/14 1558  Johnna Acosta, MD 09/16/14 [phone]

## 2014-09-19 ENCOUNTER — Other Ambulatory Visit: Payer: BC Managed Care – PPO

## 2014-11-19 ENCOUNTER — Emergency Department (HOSPITAL_COMMUNITY): Payer: BLUE CROSS/BLUE SHIELD

## 2014-11-19 ENCOUNTER — Emergency Department (HOSPITAL_COMMUNITY)
Admission: EM | Admit: 2014-11-19 | Discharge: 2014-11-20 | Disposition: A | Discharge status: Home / Self Care | Payer: BLUE CROSS/BLUE SHIELD | Source: Home / Self Care | Attending: Emergency Medicine | Admitting: Emergency Medicine

## 2014-11-19 ENCOUNTER — Emergency Department (HOSPITAL_COMMUNITY)
Admission: EM | Admit: 2014-11-19 | Discharge: 2014-11-19 | Disposition: A | Discharge status: Home / Self Care | Payer: BLUE CROSS/BLUE SHIELD | Attending: Emergency Medicine | Admitting: Emergency Medicine

## 2014-11-19 ENCOUNTER — Encounter (HOSPITAL_COMMUNITY): Payer: Self-pay | Admitting: Emergency Medicine

## 2014-11-19 DIAGNOSIS — Z791 Long term (current) use of non-steroidal anti-inflammatories (NSAID): Secondary | ICD-10-CM | POA: Insufficient documentation

## 2014-11-19 DIAGNOSIS — Z79899 Other long term (current) drug therapy: Secondary | ICD-10-CM

## 2014-11-19 DIAGNOSIS — Z87440 Personal history of urinary (tract) infections: Secondary | ICD-10-CM

## 2014-11-19 DIAGNOSIS — Z3202 Encounter for pregnancy test, result negative: Secondary | ICD-10-CM | POA: Diagnosis not present

## 2014-11-19 DIAGNOSIS — N201 Calculus of ureter: Secondary | ICD-10-CM

## 2014-11-19 DIAGNOSIS — Z8619 Personal history of other infectious and parasitic diseases: Secondary | ICD-10-CM

## 2014-11-19 DIAGNOSIS — M549 Dorsalgia, unspecified: Secondary | ICD-10-CM | POA: Diagnosis present

## 2014-11-19 DIAGNOSIS — Z8719 Personal history of other diseases of the digestive system: Secondary | ICD-10-CM

## 2014-11-19 LAB — URINALYSIS, ROUTINE W REFLEX MICROSCOPIC
Bilirubin Urine: NEGATIVE
GLUCOSE, UA: NEGATIVE mg/dL
Ketones, ur: 15 mg/dL — AB
Nitrite: NEGATIVE
Protein, ur: NEGATIVE mg/dL
Specific Gravity, Urine: 1.011 (ref 1.005–1.030)
UROBILINOGEN UA: 0.2 mg/dL (ref 0.0–1.0)
pH: 6.5 (ref 5.0–8.0)

## 2014-11-19 LAB — URINE MICROSCOPIC-ADD ON

## 2014-11-19 LAB — I-STAT BETA HCG BLOOD, ED (MC, WL, AP ONLY): I-stat hCG, quantitative: <5 m[IU]/mL (ref <5)

## 2014-11-19 MED ORDER — OXYCODONE-ACETAMINOPHEN 5-325 MG PO TABS: 1.0000 | ORAL_TABLET | ORAL | Status: DC | PRN

## 2014-11-19 MED ORDER — SODIUM CHLORIDE 0.9 % IV BOLUS (SEPSIS)
500.0000 mL | Freq: Once | INTRAVENOUS | Status: AC
  Administered 2014-11-19: 500 mL via INTRAVENOUS

## 2014-11-19 MED ORDER — ONDANSETRON 8 MG PO TBDP: 8.0000 mg | ORAL_TABLET | Freq: Three times a day (TID) | ORAL | Status: DC | PRN

## 2014-11-19 MED ORDER — SODIUM CHLORIDE 0.9 % IV BOLUS (SEPSIS)
1000.0000 mL | Freq: Once | INTRAVENOUS | Status: AC
  Administered 2014-11-19: 1000 mL via INTRAVENOUS

## 2014-11-19 MED ORDER — ONDANSETRON HCL 4 MG/2ML IJ SOLN
4.0000 mg | Freq: Once | INTRAMUSCULAR | Status: AC
  Administered 2014-11-19: 4 mg via INTRAVENOUS
  Filled 2014-11-19: qty 2

## 2014-11-19 MED ORDER — HYDROMORPHONE HCL 1 MG/ML IJ SOLN
1.0000 mg | Freq: Once | INTRAMUSCULAR | Status: AC
  Administered 2014-11-19: 1 mg via INTRAVENOUS
  Filled 2014-11-19: qty 1

## 2014-11-19 MED ORDER — PROMETHAZINE HCL 25 MG/ML IJ SOLN
12.5000 mg | Freq: Once | INTRAMUSCULAR | Status: AC
  Administered 2014-11-19: 12.5 mg via INTRAVENOUS
  Filled 2014-11-19: qty 1

## 2014-11-19 MED ORDER — HYDROMORPHONE HCL 2 MG PO TABS
2.0000 mg | ORAL_TABLET | Freq: Once | ORAL | Status: AC
  Administered 2014-11-19: 2 mg via ORAL
  Filled 2014-11-19: qty 1

## 2014-11-19 NOTE — Discharge Instructions (Signed)

## 2014-11-19 NOTE — ED Notes (Signed)
Bed: WA06 Expected date: 11/19/14 Expected time: 2:41 AM Means of arrival: Ambulance Comments: 44 yo F  Back pain

## 2014-11-19 NOTE — ED Notes (Signed)
Pt wheeled to waiting room. Pt unable to drive.

## 2014-11-19 NOTE — ED Notes (Signed)
Pt sts pain just started about 1.5 hrs ago.  Pt sts pain in R side that radiates around to R groin.  Pt denies hx of kidney stones.  Pt sts hurts to take a deep breath.  sts has been peeing a lot today and has symptoms of UTI today.

## 2014-11-19 NOTE — ED Provider Notes (Signed)
CSN: [id number]     Arrival date & time 11/19/14  0259 History   First MD Initiated Contact with Patient 11/19/14 0315     Chief Complaint  Patient presents with  . Back Pain     (Consider location/radiation/quality/duration/timing/severity/associated sxs/prior Treatment) Patient is a 44 y.o. female presenting with flank pain. The history is provided by the patient. No language interpreter was used.  Flank Pain This is a new problem. The current episode started today. Associated symptoms include abdominal pain, nausea and vomiting. Pertinent negatives include no chills or fever. Associated symptoms comments: Sharp sudden onset right flank pain tonight with nausea and vomiting. Pain radiates around to right lower abdomen. She feels she has to urinate but has not been able to go. No fever. No history of kidney stones. .    Past Medical History  Diagnosis Date  . Low back pain   . H/O urinary tract infection   . HPV in female   . Headache(784.0)   . Mumps   . Chicken pox   . H/O wisdom tooth extraction    No past surgical history on file. Family History  Problem Relation Age of Onset  . Heart disease Mother   . Heart disease Father    History  Substance Use Topics  . Smoking status: Never Smoker   . Smokeless tobacco: Never Used  . Alcohol Use: No   OB History    Gravida Para Term Preterm AB TAB SAB Ectopic Multiple Living   2 1             Review of Systems  Constitutional: Negative for fever and chills.  HENT: Negative.   Respiratory: Negative.   Cardiovascular: Negative.   Gastrointestinal: Positive for nausea, vomiting and abdominal pain.  Genitourinary: Positive for frequency, flank pain and difficulty urinating.  Musculoskeletal: Positive for back pain.  Skin: Negative.   Neurological: Negative.       Allergies  Other  Home Medications   Prior to Admission medications   Medication Sig Start Date End Date Taking? Authorizing Provider  albuterol  (PROVENTIL HFA;VENTOLIN HFA) 108 (90 BASE) MCG/ACT inhaler Inhale 1-2 puffs into the lungs every 6 (six) hours as needed for wheezing or shortness of breath.   Yes Historical Provider, MD  cetirizine (ZYRTEC) 10 MG tablet Take 10 mg by mouth daily as needed for allergies.   Yes Historical Provider, MD  citalopram (CELEXA) 40 MG tablet Take 60 mg by mouth daily. 09/07/14  Yes Historical Provider, MD  naproxen sodium (ANAPROX) 220 MG tablet Take 220-440 mg by mouth 2 (two) times daily as needed (pain).   Yes Historical Provider, MD  phentermine (ADIPEX-P) 37.5 MG tablet Take 37.5 mg by mouth daily before breakfast.  11/04/14  Yes Historical Provider, MD  propranolol (INDERAL) 20 MG tablet Take 20 mg by mouth 2 (two) times daily. 09/12/14  Yes Historical Provider, MD  HYDROcodone-acetaminophen (NORCO/VICODIN) 5-325 MG per tablet Take 1 tablet by mouth every 6 (six) hours as needed for moderate pain or severe pain. Patient not taking: Reported on 11/19/2014 09/15/14   Harvie Heck, PA-C  norethindrone (ORTHO MICRONOR) 0.35 MG tablet Take 1 tablet (0.35 mg total) by mouth daily. 10/25/12 10/25/13  Elmira Powell, PA-C  ondansetron (ZOFRAN) 4 MG tablet Take 1 tablet (4 mg total) by mouth every 8 (eight) hours as needed for nausea or vomiting. Patient not taking: Reported on 11/19/2014 09/15/14   Harvie Heck, PA-C   BP 133/78 mmHg  Pulse 89  Temp(Src) 97.9 F (36.6 C) (Oral)  Resp 18  SpO2 98%  LMP  Physical Exam  Constitutional: She is oriented to person, place, and time. She appears well-developed and well-nourished.  HENT:  Head: Normocephalic.  Neck: Normal range of motion. Neck supple.  Cardiovascular: Normal rate and regular rhythm.   Pulmonary/Chest: Effort normal and breath sounds normal. She has no wheezes. She has no rales.  Abdominal: Soft. Bowel sounds are normal. There is tenderness. There is no rebound and no guarding.  Genitourinary:  No reproducible right flank tenderness.    Musculoskeletal: Normal range of motion.  Neurological: She is alert and oriented to person, place, and time.  Skin: Skin is warm and dry. No rash noted.  Psychiatric: She has a normal mood and affect.    ED Course  Procedures (including critical care time) Labs Review Labs Reviewed  URINALYSIS, ROUTINE W REFLEX MICROSCOPIC  I-STAT BETA HCG BLOOD, ED ([facility], [facility], [facility] ONLY)   Results for orders placed or performed during the hospital encounter of 11/19/14  Urinalysis, Routine w reflex microscopic  Result Value Ref Range   Color, Urine YELLOW YELLOW   APPearance CLOUDY (A) CLEAR   Specific Gravity, Urine 1.011 1.005 - 1.030   pH 6.5 5.0 - 8.0   Glucose, UA NEGATIVE NEGATIVE mg/dL   Hgb urine dipstick LARGE (A) NEGATIVE   Bilirubin Urine NEGATIVE NEGATIVE   Ketones, ur 15 (A) NEGATIVE mg/dL   Protein, ur NEGATIVE NEGATIVE mg/dL   Urobilinogen, UA 0.2 0.0 - 1.0 mg/dL   Nitrite NEGATIVE NEGATIVE   Leukocytes, UA TRACE (A) NEGATIVE  Urine microscopic-add on  Result Value Ref Range   Squamous Epithelial / LPF FEW (A) RARE   WBC, UA 0-2 <3 WBC/hpf   RBC / HPF TOO NUMEROUS TO COUNT <3 RBC/hpf   Bacteria, UA RARE RARE  I-Stat Beta hCG blood, ED ([facility], [facility], [facility] only)  Result Value Ref Range   I-stat hCG, quantitative <5.0 <5 mIU/mL   Comment 3           Ct Abdomen Pelvis Wo Contrast  11/19/2014   CLINICAL DATA:  Acute onset of right flank pain, radiating to the right groin. Initial encounter.  EXAM: CT ABDOMEN AND PELVIS WITHOUT CONTRAST  TECHNIQUE: Multidetector CT imaging of the abdomen and pelvis was performed following the standard protocol without IV contrast.  COMPARISON:  CT of the abdomen and pelvis from 09/15/2014  FINDINGS: The visualized lung bases are clear.  The liver and spleen are unremarkable in appearance. The gallbladder is within normal limits. The pancreas and adrenal glands are unremarkable.  Minimal right-sided hydronephrosis is noted, with mild prominence of the right  ureter along its entire course, and an obstructing 3 mm stone in the distal right ureter, just above the right vesicoureteral junction. No nonobstructing renal stones are identified. The left kidney is unremarkable in appearance.  No free fluid is identified. The small bowel is unremarkable in appearance. The stomach is within normal limits. No acute vascular abnormalities are seen.  The appendix is grossly unremarkable in appearance, though difficult to fully assess. There is no evidence of appendicitis. The colon is unremarkable in appearance.  The bladder is mildly distended and grossly unremarkable. The uterus is unremarkable in appearance. The ovaries are relatively symmetric. No suspicious adnexal masses are seen. No inguinal lymphadenopathy is seen.  No acute osseous abnormalities are identified.  IMPRESSION: Minimal right-sided hydronephrosis, with prominence of the right ureter, and an obstructing 3 mm stone in  the distal right ureter, just above the right vesicoureteral junction.   Electronically Signed   By: Garald Balding M.D.   On: 11/19/2014 05:45     Imaging Review No results found.   EKG Interpretation None      MDM   Final diagnoses:  None   1. Ureteral stone  5:20: Pain is improved with IV Dilaudid, nausea well controlled.  6:15: Pain remains controlled. Ureteral stone visualized on CT. VSS. Will refer to urology. Return precautions provided.    Dewaine Oats, PA-C 11/19/14 [place], MD 11/19/14 [phone]

## 2014-11-19 NOTE — ED Notes (Signed)
Pt seen last night for ureteral stone, placed on nausea and pain medications. Pt has been able to keep medication down, but has not relieved pain. Pt has been able to eat and drink, but noted to have dry mucous membranes.

## 2014-11-19 NOTE — ED Notes (Signed)
Pt c/o R flank pain, seen here today, confirmed kidney stone by CT scan. Pt states pain and nausea progressively worsened despite prescription meds.

## 2014-11-19 NOTE — ED Provider Notes (Signed)
CSN: [id number]     Arrival date & time 11/19/14  2051 History   First MD Initiated Contact with Patient 11/19/14 2055     Chief Complaint  Patient presents with  . Flank Pain  . Nephrolithiasis     (Consider location/radiation/quality/duration/timing/severity/associated sxs/prior Treatment) Patient is a 44 y.o. female presenting with flank pain. The history is provided by the patient. No language interpreter was used.  Flank Pain This is a new problem. Associated symptoms include nausea and vomiting. Pertinent negatives include no chills or fever. Associated symptoms comments: The patient returns to the ED with uncontrolled pain of ureteral stone diagnosed last night. No fever. She has been taking Percocet and Zofran without relief of pain..    Past Medical History  Diagnosis Date  . Low back pain   . H/O urinary tract infection   . HPV in female   . Headache(784.0)   . Mumps   . Chicken pox   . H/O wisdom tooth extraction    History reviewed. No pertinent past surgical history. Family History  Problem Relation Age of Onset  . Heart disease Mother   . Heart disease Father    History  Substance Use Topics  . Smoking status: Never Smoker   . Smokeless tobacco: Never Used  . Alcohol Use: No   OB History    Gravida Para Term Preterm AB TAB SAB Ectopic Multiple Living   2 1             Review of Systems  Constitutional: Negative for fever and chills.  Respiratory: Negative.   Cardiovascular: Negative.   Gastrointestinal: Positive for nausea and vomiting.  Genitourinary: Positive for flank pain.  Musculoskeletal: Negative.   Skin: Negative.   Neurological: Negative.       Allergies  Other  Home Medications   Prior to Admission medications   Medication Sig Start Date End Date Taking? Authorizing Provider  cetirizine (ZYRTEC) 10 MG tablet Take 10 mg by mouth daily as needed for allergies.   Yes Historical Provider, MD  citalopram (CELEXA) 40 MG tablet Take 60  mg by mouth daily. 09/07/14  Yes Historical Provider, MD  naproxen sodium (ANAPROX) 220 MG tablet Take 220-440 mg by mouth 2 (two) times daily as needed (pain).   Yes Historical Provider, MD  ondansetron (ZOFRAN ODT) 8 MG disintegrating tablet Take 1 tablet (8 mg total) by mouth every 8 (eight) hours as needed for nausea or vomiting. 11/19/14  Yes Aubrey Voong A Eion Timbrook, PA-C  oxyCODONE-acetaminophen (PERCOCET/ROXICET) 5-325 MG per tablet Take 1-2 tablets by mouth every 4 (four) hours as needed for severe pain. 11/19/14  Yes Joanell Cressler A Jordani Nunn, PA-C  phentermine (ADIPEX-P) 37.5 MG tablet Take 37.5 mg by mouth daily before breakfast.  11/04/14  Yes Historical Provider, MD  propranolol (INDERAL) 20 MG tablet Take 20 mg by mouth 2 (two) times daily. 09/12/14  Yes Historical Provider, MD  albuterol (PROVENTIL HFA;VENTOLIN HFA) 108 (90 BASE) MCG/ACT inhaler Inhale 1-2 puffs into the lungs every 6 (six) hours as needed for wheezing or shortness of breath.    Historical Provider, MD  HYDROcodone-acetaminophen (NORCO/VICODIN) 5-325 MG per tablet Take 1 tablet by mouth every 6 (six) hours as needed for moderate pain or severe pain. Patient not taking: Reported on 11/19/2014 09/15/14   Harvie Heck, PA-C  norethindrone (ORTHO MICRONOR) 0.35 MG tablet Take 1 tablet (0.35 mg total) by mouth daily. 10/25/12 10/25/13  Earnstine Regal, PA-C  ondansetron (ZOFRAN) 4 MG tablet Take 1 tablet (4  mg total) by mouth every 8 (eight) hours as needed for nausea or vomiting. Patient not taking: Reported on 11/19/2014 09/15/14   Harvie Heck, PA-C   BP 137/84 mmHg  Pulse 99  Temp(Src) 97.5 F (36.4 C) (Oral)  Resp 17  Ht 5\' 3"  (1.6 m)  Wt 260 lb (117.935 kg)  BMI 46.07 kg/m2  SpO2 97%  LMP 09/18/2014 Physical Exam  Constitutional: She is oriented to person, place, and time. She appears well-developed and well-nourished.  Neck: Normal range of motion.  Pulmonary/Chest: Effort normal.  Genitourinary:  Right flank tenderness that  extends to RLQ.  Neurological: She is alert and oriented to person, place, and time.  Skin: Skin is warm and dry.    ED Course  Procedures (including critical care time) Labs Review Labs Reviewed - No data to display  Imaging Review Ct Abdomen Pelvis Wo Contrast  11/19/2014   CLINICAL DATA:  Acute onset of right flank pain, radiating to the right groin. Initial encounter.  EXAM: CT ABDOMEN AND PELVIS WITHOUT CONTRAST  TECHNIQUE: Multidetector CT imaging of the abdomen and pelvis was performed following the standard protocol without IV contrast.  COMPARISON:  CT of the abdomen and pelvis from 09/15/2014  FINDINGS: The visualized lung bases are clear.  The liver and spleen are unremarkable in appearance. The gallbladder is within normal limits. The pancreas and adrenal glands are unremarkable.  Minimal right-sided hydronephrosis is noted, with mild prominence of the right ureter along its entire course, and an obstructing 3 mm stone in the distal right ureter, just above the right vesicoureteral junction. No nonobstructing renal stones are identified. The left kidney is unremarkable in appearance.  No free fluid is identified. The small bowel is unremarkable in appearance. The stomach is within normal limits. No acute vascular abnormalities are seen.  The appendix is grossly unremarkable in appearance, though difficult to fully assess. There is no evidence of appendicitis. The colon is unremarkable in appearance.  The bladder is mildly distended and grossly unremarkable. The uterus is unremarkable in appearance. The ovaries are relatively symmetric. No suspicious adnexal masses are seen. No inguinal lymphadenopathy is seen.  No acute osseous abnormalities are identified.  IMPRESSION: Minimal right-sided hydronephrosis, with prominence of the right ureter, and an obstructing 3 mm stone in the distal right ureter, just above the right vesicoureteral junction.   Electronically Signed   By: Garald Balding M.D.    On: 11/19/2014 05:45     EKG Interpretation None      MDM   Final diagnoses:  None    1. Ureteral stones  Symptoms are controlled in ED. Nausea returns and is relieved with medications. Pain remains a "2" after oral Dilaudid.  Patient has her Percocet Rx bottle from prescription that was filled yesterday. This is replaced today with Rx for oral Dilaudid.   12:45 - pain is much better, nausea resolved. Will observe over time for further vomiting, and do PO challenge prior to discharge.   1:30 - pain returning. Discussed transition to PO pain medication, which is ordered. Nausea is minimal.  2:00 - Phenergan ordered for further nausea/vomiting. Continue to observe.  3:00 - re-eval:  Patient resting comfortably. She will stay until am given weather conditions and availability of transportation. Care and final disposition left with oncoming PA and Dr. Lita Mains.  Dewaine Oats, PA-C 11/20/14 0160  Julianne Rice, MD 11/20/14 [phone]

## 2014-11-19 NOTE — ED Notes (Signed)
Upper back pain, pt sts hasn't felt this bad with last UTI.  Child at beside.

## 2014-11-19 NOTE — ED Notes (Signed)
Pt had sudden onset nausea and emesis x 1

## 2014-11-20 MED ORDER — HYDROMORPHONE HCL 2 MG PO TABS: 2.0000 mg | ORAL_TABLET | ORAL | Status: DC | PRN

## 2014-11-20 MED ORDER — ONDANSETRON HCL 4 MG/2ML IJ SOLN
4.0000 mg | Freq: Once | INTRAMUSCULAR | Status: AC
  Administered 2014-11-20: 4 mg via INTRAVENOUS
  Filled 2014-11-20: qty 2

## 2014-11-20 MED ORDER — PROMETHAZINE HCL 25 MG/ML IJ SOLN
12.5000 mg | Freq: Once | INTRAMUSCULAR | Status: AC
  Administered 2014-11-20: 12.5 mg via INTRAVENOUS
  Filled 2014-11-20: qty 1

## 2014-11-20 NOTE — ED Provider Notes (Signed)
PROGRESS NOTE                                                                                                                 This is a sign-out from PA [place] at shift change: Candice Oconnor is a 44 y.o. female presenting with kidney stones. Plan is to recheck pain control. Please refer to previous note for full HPI, ROS, PMH and PE.   6:36 AM: Patient seen and evaluated the bedside, she is resting comfortably and awake. States that her pain is 0 out of 10. Discussed plan discharge to home, there is a slightly warm outside, she will take a taxi home. Discussed return precautions.  Filed Vitals:   11/19/14 2058 11/19/14 2236 11/20/14 0122 11/20/14 0242  BP: 161/100 137/84 132/72 119/65  Pulse: 94 99 114 104  Temp: 97.5 F (36.4 C)     TempSrc: Oral     Resp: 22 17 18 17   Height: 5\' 3"  (1.6 m)     Weight: 260 lb (117.935 kg)     SpO2: 99% 97% 100% 100%    Medications  HYDROmorphone (DILAUDID) injection 1 mg (1 mg Intravenous Given 11/19/14 2114)  promethazine (PHENERGAN) injection 12.5 mg (12.5 mg Intravenous Given 11/19/14 2114)  sodium chloride 0.9 % bolus 1,000 mL (0 mLs Intravenous Stopped 11/19/14 2213)  HYDROmorphone (DILAUDID) tablet 2 mg (2 mg Oral Given 11/19/14 2233)  ondansetron (ZOFRAN) injection 4 mg (4 mg Intravenous Given 11/19/14 2351)  promethazine (PHENERGAN) injection 12.5 mg (12.5 mg Intravenous Given 11/20/14 0119)  ondansetron (ZOFRAN) injection 4 mg (4 mg Intravenous Given 11/20/14 0436)    Evaluation does not show pathology that would require ongoing emergent intervention or inpatient treatment. Pt is hemodynamically stable and mentating appropriately. Discussed findings and plan with patient/guardian, who agrees with care plan. All questions answered. Return precautions discussed and outpatient follow up given.   New Prescriptions   HYDROMORPHONE (DILAUDID) 2 MG TABLET    Take 1 tablet (2 mg total) by mouth every 4 (four) hours as needed for severe pain.         Monico Blitz, PA-C 11/20/14 4627  Julianne Rice, MD 11/24/14 [phone]

## 2014-11-20 NOTE — ED Notes (Signed)
Patient ambulated to the bathroom, where she vomited a moderate amount of emesis.  Nehemiah Settle, [place], made aware.  New orders received.

## 2014-11-20 NOTE — Discharge Instructions (Signed)

## 2015-07-22 ENCOUNTER — Encounter (HOSPITAL_COMMUNITY): Payer: Self-pay | Admitting: Emergency Medicine

## 2015-07-22 ENCOUNTER — Emergency Department (HOSPITAL_COMMUNITY): Payer: BLUE CROSS/BLUE SHIELD

## 2015-07-22 ENCOUNTER — Emergency Department (HOSPITAL_COMMUNITY)
Admission: EM | Admit: 2015-07-22 | Discharge: 2015-07-22 | Disposition: A | Discharge status: Home / Self Care | Payer: BLUE CROSS/BLUE SHIELD | Attending: Emergency Medicine | Admitting: Emergency Medicine

## 2015-07-22 DIAGNOSIS — M542 Cervicalgia: Secondary | ICD-10-CM | POA: Insufficient documentation

## 2015-07-22 DIAGNOSIS — G43909 Migraine, unspecified, not intractable, without status migrainosus: Secondary | ICD-10-CM | POA: Diagnosis not present

## 2015-07-22 DIAGNOSIS — Z87440 Personal history of urinary (tract) infections: Secondary | ICD-10-CM | POA: Diagnosis not present

## 2015-07-22 DIAGNOSIS — R109 Unspecified abdominal pain: Secondary | ICD-10-CM | POA: Insufficient documentation

## 2015-07-22 DIAGNOSIS — Z792 Long term (current) use of antibiotics: Secondary | ICD-10-CM | POA: Insufficient documentation

## 2015-07-22 DIAGNOSIS — Z79899 Other long term (current) drug therapy: Secondary | ICD-10-CM | POA: Insufficient documentation

## 2015-07-22 DIAGNOSIS — M545 Low back pain: Secondary | ICD-10-CM | POA: Insufficient documentation

## 2015-07-22 DIAGNOSIS — Z8619 Personal history of other infectious and parasitic diseases: Secondary | ICD-10-CM | POA: Insufficient documentation

## 2015-07-22 LAB — URINALYSIS, ROUTINE W REFLEX MICROSCOPIC
Bilirubin Urine: NEGATIVE
Glucose, UA: NEGATIVE mg/dL
KETONES UR: NEGATIVE mg/dL
NITRITE: NEGATIVE
PROTEIN: NEGATIVE mg/dL
Specific Gravity, Urine: 1.015 (ref 1.005–1.030)
UROBILINOGEN UA: 0.2 mg/dL (ref 0.0–1.0)
pH: 7 (ref 5.0–8.0)

## 2015-07-22 LAB — URINE MICROSCOPIC-ADD ON

## 2015-07-22 MED ORDER — DIPHENHYDRAMINE HCL 50 MG/ML IJ SOLN
25.0000 mg | Freq: Once | INTRAMUSCULAR | Status: AC
  Administered 2015-07-22: 25 mg via INTRAVENOUS
  Filled 2015-07-22: qty 1

## 2015-07-22 MED ORDER — IOHEXOL 300 MG/ML  SOLN: 50.0000 mL | Freq: Once | INTRAMUSCULAR | Status: DC | PRN

## 2015-07-22 MED ORDER — KETOROLAC TROMETHAMINE 30 MG/ML IJ SOLN
30.0000 mg | Freq: Once | INTRAMUSCULAR | Status: AC
  Administered 2015-07-22: 30 mg via INTRAVENOUS
  Filled 2015-07-22: qty 1

## 2015-07-22 MED ORDER — METOCLOPRAMIDE HCL 5 MG/ML IJ SOLN
10.0000 mg | Freq: Once | INTRAMUSCULAR | Status: AC
  Administered 2015-07-22: 10 mg via INTRAVENOUS
  Filled 2015-07-22: qty 2

## 2015-07-22 MED ORDER — SODIUM CHLORIDE 0.9 % IV SOLN
Freq: Once | INTRAVENOUS | Status: AC
  Administered 2015-07-22: 07:00:00 via INTRAVENOUS

## 2015-07-22 NOTE — ED Provider Notes (Signed)
CSN: [id number]     Arrival date & time 07/22/15  0602 History   First MD Initiated Contact with Patient 07/22/15 [phone]     Chief Complaint  Patient presents with  . Back Pain  . Neck Pain     (Consider location/radiation/quality/duration/timing/severity/associated sxs/prior Treatment) Patient is a 44 y.o. female presenting with back pain and neck pain. The history is provided by the patient. No language interpreter was used.  Back Pain Location:  Generalized Quality:  Aching Radiates to:  Does not radiate Pain severity:  Moderate Pain is:  Same all the time Onset quality:  Gradual Timing:  Constant Progression:  Worsening Chronicity:  New Context: not recent illness   Context comment:  Uti Relieved by:  Nothing Worsened by:  Nothing tried Ineffective treatments:  None tried Associated symptoms: no fever   Neck Pain Associated symptoms: no fever     Past Medical History  Diagnosis Date  . Low back pain   . H/O urinary tract infection   . HPV in female   . Headache(784.0)   . Mumps   . Chicken pox   . H/O wisdom tooth extraction    History reviewed. No pertinent past surgical history. Family History  Problem Relation Age of Onset  . Heart disease Mother   . Heart disease Father    Social History  Substance Use Topics  . Smoking status: Never Smoker   . Smokeless tobacco: Never Used  . Alcohol Use: No   OB History    Gravida Para Term Preterm AB TAB SAB Ectopic Multiple Living   2 1             Review of Systems  Constitutional: Negative for fever.  Musculoskeletal: Positive for back pain and neck pain.  All other systems reviewed and are negative.     Allergies  Other  Home Medications   Prior to Admission medications   Medication Sig Start Date End Date Taking? Authorizing Provider  albuterol (PROVENTIL HFA;VENTOLIN HFA) 108 (90 BASE) MCG/ACT inhaler Inhale 1-2 puffs into the lungs every 6 (six) hours as needed for wheezing or shortness of  breath.   Yes Historical Provider, MD  ciprofloxacin (CIPRO) 500 MG tablet Take 500 mg by mouth 2 (two) times daily.   Yes Historical Provider, MD  citalopram (CELEXA) 40 MG tablet Take 60 mg by mouth daily. 09/07/14  Yes Historical Provider, MD  ondansetron (ZOFRAN ODT) 8 MG disintegrating tablet Take 1 tablet (8 mg total) by mouth every 8 (eight) hours as needed for nausea or vomiting. 11/19/14  Yes Charlann Lange, PA-C  phentermine (ADIPEX-P) 37.5 MG tablet Take 37.5 mg by mouth daily before breakfast.  11/04/14  Yes Historical Provider, MD  propranolol (INDERAL) 20 MG tablet Take 20 mg by mouth 2 (two) times daily. 09/12/14  Yes Historical Provider, MD  cetirizine (ZYRTEC) 10 MG tablet Take 10 mg by mouth daily as needed for allergies.    Historical Provider, MD  HYDROcodone-acetaminophen (NORCO/VICODIN) 5-325 MG per tablet Take 1 tablet by mouth every 6 (six) hours as needed for moderate pain or severe pain. Patient not taking: Reported on 11/19/2014 09/15/14   Harvie Heck, PA-C  HYDROmorphone (DILAUDID) 2 MG tablet Take 1 tablet (2 mg total) by mouth every 4 (four) hours as needed for severe pain. Patient not taking: Reported on 07/22/2015 11/20/14   Charlann Lange, PA-C  naproxen sodium (ANAPROX) 220 MG tablet Take 220-440 mg by mouth 2 (two) times daily as needed (pain).  Historical Provider, MD  norethindrone (ORTHO MICRONOR) 0.35 MG tablet Take 1 tablet (0.35 mg total) by mouth daily. 10/25/12 10/25/13  Elmira Powell, PA-C  ondansetron (ZOFRAN) 4 MG tablet Take 1 tablet (4 mg total) by mouth every 8 (eight) hours as needed for nausea or vomiting. Patient not taking: Reported on 11/19/2014 09/15/14   Harvie Heck, PA-C  oxyCODONE-acetaminophen (PERCOCET/ROXICET) 5-325 MG per tablet Take 1-2 tablets by mouth every 4 (four) hours as needed for severe pain. Patient not taking: Reported on 07/22/2015 11/19/14   Charlann Lange, PA-C   BP 132/85 mmHg  Pulse 96  Temp(Src) 98.6 F (37 C) (Oral)  Resp  20  SpO2 100%  LMP 07/18/2015 (Within Days) Physical Exam  Constitutional: She is oriented to person, place, and time. She appears well-developed and well-nourished.  HENT:  Head: Normocephalic and atraumatic.  Eyes: Conjunctivae are normal. Pupils are equal, round, and reactive to light.  Neck: Normal range of motion. Neck supple.  Cardiovascular: Normal rate, normal heart sounds and intact distal pulses.   Pulmonary/Chest: Effort normal and breath sounds normal.  Abdominal: Soft.  Musculoskeletal:  Tender mid lower back,  Neck is supple,  Pain with range of motion,   c spine nontender, tender sternocleidomastoid muscles,  Neck is supplle  Neurological: She is alert and oriented to person, place, and time. She has normal reflexes.  Skin: Skin is warm.  Psychiatric: She has a normal mood and affect.  Vitals reviewed.   ED Course  Procedures (including critical care time) Labs Review Labs Reviewed  URINALYSIS, ROUTINE W REFLEX MICROSCOPIC (NOT AT [facility])    Imaging Review No results found. I have personally reviewed and evaluated these images and lab results as part of my medical decision-making.   EKG Interpretation None      MDM   Final diagnoses:  Low back pain without sciatica, unspecified back pain laterality  Migraine without status migrainosus, not intractable, unspecified migraine type    Pt given torodol, reglan and benadryl for headache    Fransico Meadow, PA-C 07/22/15 [place], MD 07/23/15 [phone]

## 2015-07-22 NOTE — Discharge Instructions (Signed)
Back Pain, Adult °Back pain is very common in adults. The cause of back pain is rarely dangerous and the pain often gets better over time. The cause of your back pain may not be known. Some common causes of back pain include: °· Strain of the muscles or ligaments supporting the spine. °· Wear and tear (degeneration) of the spinal disks. °· Arthritis. °· Direct injury to the back. °For many people, back pain may return. Since back pain is rarely dangerous, most people can learn to manage this condition on their own. °HOME CARE INSTRUCTIONS °Watch your back pain for any changes. The following actions may help to lessen any discomfort you are feeling: °· Remain active. It is stressful on your back to sit or stand in one place for long periods of time. Do not sit, drive, or stand in one place for more than 30 minutes at a time. Take short walks on even surfaces as soon as you are able. Try to increase the length of time you walk each day. °· Exercise regularly as directed by your health care provider. Exercise helps your back heal faster. It also helps avoid future injury by keeping your muscles strong and flexible. °· Do not stay in bed. Resting more than 1-2 days can delay your recovery. °· Pay attention to your body when you bend and lift. The most comfortable positions are those that put less stress on your recovering back. Always use proper lifting techniques, including: °¨ Bending your knees. °¨ Keeping the load close to your body. °¨ Avoiding twisting. °· Find a comfortable position to sleep. Use a firm mattress and lie on your side with your knees slightly bent. If you lie on your back, put a pillow under your knees. °· Avoid feeling anxious or stressed. Stress increases muscle tension and can worsen back pain. It is important to recognize when you are anxious or stressed and learn ways to manage it, such as with exercise. °· Take medicines only as directed by your health care provider. Over-the-counter  medicines to reduce pain and inflammation are often the most helpful. Your health care provider may prescribe muscle relaxant drugs. These medicines help dull your pain so you can more quickly return to your normal activities and healthy exercise. °· Apply ice to the injured area: °¨ Put ice in a plastic bag. °¨ Place a towel between your skin and the bag. °¨ Leave the ice on for 20 minutes, 2-3 times a day for the first 2-3 days. After that, ice and heat may be alternated to reduce pain and spasms. °· Maintain a healthy weight. Excess weight puts extra stress on your back and makes it difficult to maintain good posture. °SEEK MEDICAL CARE IF: °· You have pain that is not relieved with rest or medicine. °· You have increasing pain going down into the legs or buttocks. °· You have pain that does not improve in one week. °· You have night pain. °· You lose weight. °· You have a fever or chills. °SEEK IMMEDIATE MEDICAL CARE IF:  °· You develop new bowel or bladder control problems. °· You have unusual weakness or numbness in your arms or legs. °· You develop nausea or vomiting. °· You develop abdominal pain. °· You feel faint. °  °This information is not intended to replace advice given to you by your health care provider. Make sure you discuss any questions you have with your health care provider. °  °Document Released: 09/22/2005 Document Revised: 10/13/2014 Document Reviewed: 01/24/2014 °Elsevier Interactive Patient Education ©2016 [employer]   [employer] General Headache Without Cause A headache is pain or discomfort felt around the head or neck area. The specific cause of a headache may not be found. There are many causes and types of headaches. A few common ones are:  Tension headaches.  Migraine headaches.  Cluster headaches.  Chronic daily headaches. HOME CARE INSTRUCTIONS  Watch your condition for any changes. Take these steps to help with your condition: Managing Pain  Take over-the-counter and  prescription medicines only as told by your health care provider.  Lie down in a dark, quiet room when you have a headache.  If directed, apply ice to the head and neck area:  Put ice in a plastic bag.  Place a towel between your skin and the bag.  Leave the ice on for 20 minutes, 2-3 times per day.  Use a heating pad or hot shower to apply heat to the head and neck area as told by your health care provider.  Keep lights dim if bright lights bother you or make your headaches worse. Eating and Drinking  Eat meals on a regular schedule.  Limit alcohol use.  Decrease the amount of caffeine you drink, or stop drinking caffeine. General Instructions  Keep all follow-up visits as told by your health care provider. This is important.  Keep a headache journal to help find out what may trigger your headaches. For example, write down:  What you eat and drink.  How much sleep you get.  Any change to your diet or medicines.  Try massage or other relaxation techniques.  Limit stress.  Sit up straight, and do not tense your muscles.  Do not use tobacco products, including cigarettes, chewing tobacco, or e-cigarettes. If you need help quitting, ask your health care provider.  Exercise regularly as told by your health care provider.  Sleep on a regular schedule. Get 7-9 hours of sleep, or the amount recommended by your health care provider. SEEK MEDICAL CARE IF:   Your symptoms are not helped by medicine.  You have a headache that is different from the usual headache.  You have nausea or you vomit.  You have a fever. SEEK IMMEDIATE MEDICAL CARE IF:   Your headache becomes severe.  You have repeated vomiting.  You have a stiff neck.  You have a loss of vision.  You have problems with speech.  You have pain in the eye or ear.  You have muscular weakness or loss of muscle control.  You lose your balance or have trouble walking.  You feel faint or pass out.  You  have confusion.   This information is not intended to replace advice given to you by your health care provider. Make sure you discuss any questions you have with your health care provider.   Document Released: 09/22/2005 Document Revised: 06/13/2015 Document Reviewed: 01/15/2015 Elsevier Interactive Patient Education [employer].

## 2015-07-22 NOTE — ED Notes (Signed)
Pt arrived to the ED with a complaint of mid back pain.  Pt is also complaining of neck stiffness and pain.  Pt was seen Monday last and diagnosed with a UTI and given Cipro.  Pt at that time had a migraine headache but no urinary symptoms but says lab confirmed UTI dx.  Pt said next day headache went away but returned yesterday morning along with back and neck pain.

## 2015-10-04 ENCOUNTER — Other Ambulatory Visit: Payer: Self-pay | Admitting: Family Medicine

## 2015-10-04 DIAGNOSIS — Z1231 Encounter for screening mammogram for malignant neoplasm of breast: Secondary | ICD-10-CM

## 2015-10-17 ENCOUNTER — Ambulatory Visit: Payer: BLUE CROSS/BLUE SHIELD

## 2015-10-29 ENCOUNTER — Ambulatory Visit: Payer: BLUE CROSS/BLUE SHIELD

## 2015-10-30 ENCOUNTER — Ambulatory Visit
Admission: RE | Admit: 2015-10-30 | Discharge: 2015-10-30 | Disposition: A | Discharge status: Home / Self Care | Payer: 59 | Source: Ambulatory Visit | Attending: Family Medicine | Admitting: Family Medicine

## 2015-10-30 DIAGNOSIS — Z1231 Encounter for screening mammogram for malignant neoplasm of breast: Secondary | ICD-10-CM

## 2016-10-21 DIAGNOSIS — J209 Acute bronchitis, unspecified: Secondary | ICD-10-CM | POA: Diagnosis not present

## 2016-12-02 DIAGNOSIS — Z0000 Encounter for general adult medical examination without abnormal findings: Secondary | ICD-10-CM | POA: Diagnosis not present

## 2016-12-02 DIAGNOSIS — E559 Vitamin D deficiency, unspecified: Secondary | ICD-10-CM | POA: Diagnosis not present

## 2016-12-08 DIAGNOSIS — Z0000 Encounter for general adult medical examination without abnormal findings: Secondary | ICD-10-CM | POA: Diagnosis not present

## 2016-12-09 ENCOUNTER — Other Ambulatory Visit: Payer: Self-pay | Admitting: Family Medicine

## 2016-12-09 DIAGNOSIS — Z1231 Encounter for screening mammogram for malignant neoplasm of breast: Secondary | ICD-10-CM

## 2016-12-18 DIAGNOSIS — H5213 Myopia, bilateral: Secondary | ICD-10-CM | POA: Diagnosis not present

## 2017-01-11 DIAGNOSIS — J019 Acute sinusitis, unspecified: Secondary | ICD-10-CM | POA: Diagnosis not present

## 2017-01-11 DIAGNOSIS — R05 Cough: Secondary | ICD-10-CM | POA: Diagnosis not present

## 2017-02-02 DIAGNOSIS — N390 Urinary tract infection, site not specified: Secondary | ICD-10-CM | POA: Diagnosis not present

## 2017-02-04 ENCOUNTER — Ambulatory Visit: Payer: 59

## 2017-02-25 ENCOUNTER — Ambulatory Visit
Admission: RE | Admit: 2017-02-25 | Discharge: 2017-02-25 | Disposition: A | Discharge status: Home / Self Care | Payer: 59 | Source: Ambulatory Visit | Attending: Family Medicine | Admitting: Family Medicine

## 2017-02-25 DIAGNOSIS — Z1231 Encounter for screening mammogram for malignant neoplasm of breast: Secondary | ICD-10-CM | POA: Diagnosis not present

## 2017-03-11 DIAGNOSIS — T1501XA Foreign body in cornea, right eye, initial encounter: Secondary | ICD-10-CM | POA: Diagnosis not present

## 2017-06-09 DIAGNOSIS — E559 Vitamin D deficiency, unspecified: Secondary | ICD-10-CM | POA: Diagnosis not present

## 2017-06-09 DIAGNOSIS — E782 Mixed hyperlipidemia: Secondary | ICD-10-CM | POA: Diagnosis not present

## 2017-06-11 DIAGNOSIS — E559 Vitamin D deficiency, unspecified: Secondary | ICD-10-CM | POA: Diagnosis not present

## 2017-06-11 DIAGNOSIS — E782 Mixed hyperlipidemia: Secondary | ICD-10-CM | POA: Diagnosis not present

## 2017-06-11 DIAGNOSIS — Z23 Encounter for immunization: Secondary | ICD-10-CM | POA: Diagnosis not present

## 2017-08-11 DIAGNOSIS — E782 Mixed hyperlipidemia: Secondary | ICD-10-CM | POA: Diagnosis not present

## 2017-08-14 DIAGNOSIS — E782 Mixed hyperlipidemia: Secondary | ICD-10-CM | POA: Diagnosis not present

## 2017-08-31 ENCOUNTER — Encounter: Payer: Self-pay | Admitting: Family Medicine

## 2017-08-31 ENCOUNTER — Ambulatory Visit (INDEPENDENT_AMBULATORY_CARE_PROVIDER_SITE_OTHER): Payer: 59 | Admitting: Family Medicine

## 2017-08-31 VITALS — BP 109/58 | HR 97 | Temp 98.4°F | Ht 63.0 in | Wt 234.5 lb

## 2017-08-31 DIAGNOSIS — E782 Mixed hyperlipidemia: Secondary | ICD-10-CM | POA: Diagnosis not present

## 2017-08-31 NOTE — Patient Instructions (Signed)
Keep up the great work.  Consider lifting weights.  You can stop checking your blood pressure at home if you want.   Healthy Eating Plan Many factors influence your heart health, including eating and exercise habits. Heart (coronary) risk increases with abnormal blood fat (lipid) levels. Heart-healthy meal planning includes limiting unhealthy fats, increasing healthy fats, and making other small dietary changes. This includes maintaining a healthy body weight to help keep lipid levels within a normal range.  WHAT IS MY PLAN?  Your health care provider recommends that you:  Drink a glass of water before meals to help with satiety.  Eat slowly.  An alternative to the water is to add Metamucil. This will help with satiety as well. It does contain calories, unlike water.  WHAT TYPES OF FAT SHOULD I CHOOSE?  Choose healthy fats more often. Choose monounsaturated and polyunsaturated fats, such as olive oil and canola oil, flaxseeds, walnuts, almonds, and seeds.  Eat more omega-3 fats. Good choices include salmon, mackerel, sardines, tuna, flaxseed oil, and ground flaxseeds. Aim to eat fish at least two times each week.  Avoid foods with partially hydrogenated oils in them. These contain trans fats. Examples of foods that contain trans fats are stick margarine, some tub margarines, cookies, crackers, and other baked goods. If you are going to avoid a fat, this is the one to avoid!  WHAT GENERAL GUIDELINES DO I NEED TO FOLLOW?  Check food labels carefully to identify foods with trans fats. Avoid these types of options when possible.  Fill one half of your plate with vegetables and green salads. Eat 4-5 servings of vegetables per day. A serving of vegetables equals 1 cup of raw leafy vegetables,  cup of raw or cooked cut-up vegetables, or  cup of vegetable juice.  Fill one fourth of your plate with whole grains. Look for the word "whole" as the first word in the ingredient list.  Fill one  fourth of your plate with lean protein foods.  Eat 4-5 servings of fruit per day. A serving of fruit equals one medium whole fruit,  cup of dried fruit,  cup of fresh, frozen, or canned fruit. Try to avoid fruits in cups/syrups as the sugar content can be high.  Eat more foods that contain soluble fiber. Examples of foods that contain this type of fiber are apples, broccoli, carrots, beans, peas, and barley. Aim to get 20-30 g of fiber per day.  Eat more home-cooked food and less restaurant, buffet, and fast food.  Limit or avoid alcohol.  Limit foods that are high in starch and sugar.  Avoid fried foods when able.  Cook foods by using methods other than frying. Baking, boiling, grilling, and broiling are all great options. Other fat-reducing suggestions include: ? Removing the skin from poultry. ? Removing all visible fats from meats. ? Skimming the fat off of stews, soups, and gravies before serving them. ? Steaming vegetables in water or broth.  Lose weight if you are overweight. Losing just 5-10% of your initial body weight can help your overall health and prevent diseases such as diabetes and heart disease.  Increase your consumption of nuts, legumes, and seeds to 4-5 servings per week. One serving of dried beans or legumes equals  cup after being cooked, one serving of nuts equals 1 ounces, and one serving of seeds equals  ounce or 1 tablespoon.  WHAT ARE GOOD FOODS CAN I EAT? Grains Grainy breads (try to find bread that is 3 g of  fiber per slice or greater), oatmeal, light popcorn. Whole-grain cereals. Rice and pasta, including brown rice and those that are made with whole wheat. Edamame pasta is a great alternative to grain pasta. It has a higher protein content. Try to avoid significant consumption of white bread, sugary cereals, or pastries/baked goods.  Vegetables All vegetables. Cooked white potatoes do not count as vegetables.  Fruits All fruits, but limit pineapple  and bananas as these fruits have a higher sugar content.  Meats and Other Protein Sources Lean, well-trimmed beef, veal, pork, and lamb. Chicken and [place] without skin. All fish and shellfish. Wild duck, rabbit, pheasant, and venison. Egg whites or low-cholesterol egg substitutes. Dried beans, peas, lentils, and tofu.Seeds and most nuts.  Dairy Low-fat or nonfat cheeses, including ricotta, string, and mozzarella. Skim or 1% milk that is liquid, powdered, or evaporated. Buttermilk that is made with low-fat milk. Nonfat or low-fat yogurt. Soy/Almond milk are good alternatives if you cannot handle dairy.  Beverages Water is the best for you. Sports drinks with less sugar are more desirable unless you are a highly active athlete.  Sweets and Desserts Sherbets and fruit ices. Honey, jam, marmalade, jelly, and syrups. Dark chocolate.  Eat all sweets and desserts in moderation.  Fats and Oils Nonhydrogenated (trans-free) margarines. Vegetable oils, including soybean, sesame, sunflower, olive, peanut, safflower, corn, canola, and cottonseed. Salad dressings or mayonnaise that are made with a vegetable oil. Limit added fats and oils that you use for cooking, baking, salads, and as spreads.  Other Cocoa powder. Coffee and tea. Most condiments.  The items listed above may not be a complete list of recommended foods or beverages. Contact your dietitian for more options.

## 2017-08-31 NOTE — Progress Notes (Signed)
Chief Complaint  Patient presents with  . Establish Care       New Patient Visit SUBJECTIVE: HPI: Candice Oconnor is an 46 y.o.female who is being seen for establishing care.  The patient was previously seen at Dr. Retia Passe office.  The patient was being treated for obesity and hypertension at her previous office.  She is found to have high cholesterol in addition to repeated high office blood pressure readings.  She was started on Benicar 40 mg daily.  She took it 2 times and stopped because of the way it made her feel.  She admittedly had a very poor diet and was not very physically active.  3 months ago, she started making gradual lifestyle changes and has since lost 20 pounds.  She feels much better and is cleaned up her diet more.  She is starting to exercise more frequently through walking and yoga.  She is not having any chest pain or shortness of breath.  Allergies  Allergen Reactions  . 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 Chicken pox   . H/O urinary tract infection   . H/O wisdom tooth extraction   . Headache(784.0)   . HPV in female   . Mumps    Past Surgical History:  Procedure Laterality Date  . NO PAST SURGERIES     Social History   Socioeconomic History  . Marital status: Divorced   Family History  Problem Relation Age of Onset  . Heart disease Mother   . Diabetes Mother   . Heart disease Father   . Diabetes Father   . Breast cancer Neg Hx     Current Outpatient Medications:  .  ergocalciferol (VITAMIN D2) 50000 units capsule, Take 5,000 Units by mouth once a week., Disp: , Rfl:  .  montelukast (SINGULAIR) 10 MG tablet, Take 10 mg by mouth at bedtime., Disp: , Rfl:  .  ondansetron (ZOFRAN) 4 MG tablet, Take 1 tablet (4 mg total) by mouth every 8 (eight) hours as needed for nausea or vomiting., Disp: 12 tablet, Rfl: 0 .  SUMAtriptan (IMITREX) 100 MG tablet, Take 100 mg by mouth  every 2 (two) hours as needed for migraine. May repeat in 2 hours if headache persists or recurs., Disp: , Rfl:  .  albuterol (PROVENTIL HFA;VENTOLIN HFA) 108 (90 BASE) MCG/ACT inhaler, Inhale 1-2 puffs into the lungs every 6 (six) hours as needed for wheezing or shortness of breath., Disp: , Rfl:  .  cetirizine (ZYRTEC) 10 MG tablet, Take 10 mg by mouth daily as needed for allergies., Disp: , Rfl:  .  norethindrone (ORTHO MICRONOR) 0.35 MG tablet, Take 1 tablet (0.35 mg total) by mouth daily., Disp: 1 Package, Rfl: 11  ROS Cardiovascular: Denies chest pain  Respiratory: Denies dyspnea   OBJECTIVE: BP (!) 109/58 (BP Location: Left Arm, Patient Position: Sitting, Cuff Size: Large)   Pulse 97   Temp 98.4 F (36.9 C) (Oral)   Ht 5\' 3"  (1.6 m)   Wt 234 lb 8 oz (106.4 kg)   SpO2 98%   BMI 41.54 kg/m   Constitutional: -  VS reviewed -  Well developed, well nourished, appears stated age -  No apparent distress  Psychiatric: -  Oriented to person, place, and time -  Memory intact -  Affect and mood normal -  Fluent conversation, good eye contact -  Judgment and  insight age appropriate  Eye: -  Conjunctivae clear, no discharge -  Pupils symmetric, round, reactive to light  ENMT: -  MMM    Pharynx moist, no exudate, no erythema  Neck: -  No gross swelling, no palpable masses -  Thyroid midline, not enlarged, mobile, no palpable masses  Cardiovascular: -  RRR -  No bruits -  No LE edema  Respiratory: -  Normal respiratory effort, no accessory muscle use, no retraction -  Breath sounds equal, no wheezes, no ronchi, no crackles  Musculoskeletal: -  No clubbing, no cyanosis -  Gait normal  Skin: -  No significant lesion on inspection -  Warm and dry to palpation   ASSESSMENT/PLAN: Mixed hyperlipidemia  Morbid obesity ([place])  Patient instructed to sign release of records form from her previous PCP. Reviewed home blood pressure readings which were in the 110s over 70s.  Okay to stop  checking blood pressures at home.  Stop any medicine.  We will not be adding anything.  Counseled on diet and exercise.  I challenged her to lift weights.  She was also given the healthy diet handout. We will recheck her cholesterol at her physical.  She is doing very well with her changes overall. Patient should return in April for her physical. The patient voiced understanding and agreement to the plan.   [place], DO 08/31/17  12:25 PM

## 2017-08-31 NOTE — Progress Notes (Signed)
Pre visit review using our clinic review tool, if applicable. No additional management support is needed unless otherwise documented below in the visit note. 

## 2017-09-07 ENCOUNTER — Encounter: Payer: Self-pay | Admitting: Family Medicine

## 2017-09-07 ENCOUNTER — Other Ambulatory Visit: Payer: Self-pay | Admitting: Family Medicine

## 2017-10-21 ENCOUNTER — Encounter: Payer: Self-pay | Admitting: Family Medicine

## 2017-10-21 ENCOUNTER — Ambulatory Visit (INDEPENDENT_AMBULATORY_CARE_PROVIDER_SITE_OTHER): Payer: 59 | Admitting: Family Medicine

## 2017-10-21 VITALS — BP 110/74 | HR 92 | Temp 98.8°F | Ht 63.0 in | Wt 232.0 lb

## 2017-10-21 DIAGNOSIS — R51 Headache: Secondary | ICD-10-CM

## 2017-10-21 DIAGNOSIS — H538 Other visual disturbances: Secondary | ICD-10-CM

## 2017-10-21 DIAGNOSIS — R519 Headache, unspecified: Secondary | ICD-10-CM

## 2017-10-21 MED ORDER — PROPRANOLOL HCL 20 MG PO TABS: 20.0000 mg | ORAL_TABLET | Freq: Three times a day (TID) | ORAL | 1 refills | Status: DC

## 2017-10-21 NOTE — Progress Notes (Signed)
Chief Complaint  Patient presents with  . Headache    Candice Oconnor is a 47 y.o. female here for evaluation of left headache.  Duration: 6 weeks her nml headaches have gotten worse Palliation: None Provocation: unsure Quality: achy. Associated symptoms: nausea, vision changes Denies: jaw pain, injury, vomiting, diplopia, lacrimation, neurologic s/s's Currently with headache? No Failed therapies: Imitrex has not been helpful Hx of allergies, wondering if sinuses are related, currently on Zyrtec, Astelin and Singulair. HA will wake her up at night.   Past Medical History:  Diagnosis Date  . Chicken pox   . HPV in female   . Hyperlipidemia   . Migraine    Family History  Problem Relation Age of Onset  . Heart disease Mother   . Diabetes Mother   . Heart disease Father   . Diabetes Father   . Breast cancer Neg Hx    Current Meds  Medication Sig  . albuterol (PROVENTIL HFA;VENTOLIN HFA) 108 (90 BASE) MCG/ACT inhaler Inhale 1-2 puffs into the lungs every 6 (six) hours as needed for wheezing or shortness of breath.  Marland Kitchen azelastine (ASTELIN) 0.1 % nasal spray Place 2 sprays into both nostrils 2 (two) times daily. Use in each nostril as directed  . Cholecalciferol (VITAMIN D3) 5000 units TABS Take 1 tab daily.  . montelukast (SINGULAIR) 10 MG tablet Take 10 mg by mouth at bedtime.  . SUMAtriptan (IMITREX) 100 MG tablet Take 100 mg by mouth every 2 (two) hours as needed for migraine. May repeat in 2 hours if headache persists or recurs.    BP 110/74 (BP Location: Left Arm, Patient Position: Sitting, Cuff Size: Large)   Pulse 92   Temp 98.8 F (37.1 C) (Oral)   Ht 5\' 3"  (1.6 m)   Wt 232 lb (105.2 kg)   SpO2 97%   BMI 41.10 kg/m  General: awake, alert, appearing stated age Eyes: PERRLA, EOMi Heart: RRR, no murmurs, no bruits Lungs: CTAB, no accessory muscle use Neuro: CN 2-12 intact, no cerebellar signs, DTR's equal and symmetry, no clonus MSK: 5/5 strength throughout,  normal gait, no TTP over posterior cervical triangle or paraspinal cervical musculature Psych: Age appropriate judgment and insight, mood and affect normal  Nonintractable headache, unspecified chronicity pattern, unspecified headache type - Plan: MR Brain Wo Contrast  Blurred vision, bilateral  Orders as above. Add Propranolol for daily management. MR to r/o sinister process given nighttime awakenings and vision changes. Follow up in 4 weeks to reck ha's.  The patient voiced understanding and agreement to the plan.  Crosby Oyster [place], [place] 12:08 PM 10/21/17

## 2017-10-21 NOTE — Patient Instructions (Addendum)
Try to cut back on chewing gum.   Try Allegra to see if it helps with your sinus pressure.  If you do not hear anything about your MRI in the next 1-2 weeks, call our office and ask for an update.  Let us know if you need anything.

## 2017-10-24 ENCOUNTER — Ambulatory Visit (HOSPITAL_BASED_OUTPATIENT_CLINIC_OR_DEPARTMENT_OTHER)
Admission: RE | Admit: 2017-10-24 | Discharge: 2017-10-24 | Disposition: A | Discharge status: Home / Self Care | Payer: 59 | Source: Ambulatory Visit | Attending: Family Medicine | Admitting: Family Medicine

## 2017-10-24 DIAGNOSIS — R519 Headache, unspecified: Secondary | ICD-10-CM

## 2017-10-24 DIAGNOSIS — R51 Headache: Secondary | ICD-10-CM | POA: Diagnosis not present

## 2017-10-26 ENCOUNTER — Ambulatory Visit (INDEPENDENT_AMBULATORY_CARE_PROVIDER_SITE_OTHER): Payer: 59 | Admitting: Family Medicine

## 2017-10-26 ENCOUNTER — Encounter: Payer: Self-pay | Admitting: Family Medicine

## 2017-10-26 VITALS — BP 116/76 | HR 72 | Temp 98.2°F | Resp 16 | Ht 63.0 in | Wt 230.4 lb

## 2017-10-26 DIAGNOSIS — H53451 Other localized visual field defect, right eye: Secondary | ICD-10-CM | POA: Diagnosis not present

## 2017-10-26 DIAGNOSIS — H538 Other visual disturbances: Secondary | ICD-10-CM | POA: Diagnosis not present

## 2017-10-26 DIAGNOSIS — R519 Headache, unspecified: Secondary | ICD-10-CM

## 2017-10-26 DIAGNOSIS — R51 Headache: Secondary | ICD-10-CM

## 2017-10-26 LAB — COMPREHENSIVE METABOLIC PANEL
ALBUMIN: 4.2 g/dL (ref 3.5–5.2)
ALT: 11 U/L (ref 0–35)
AST: 11 U/L (ref 0–37)
Alkaline Phosphatase: 53 U/L (ref 39–117)
BILIRUBIN TOTAL: 0.3 mg/dL (ref 0.2–1.2)
BUN: 14 mg/dL (ref 6–23)
CO2: 28 mEq/L (ref 19–32)
Calcium: 9.2 mg/dL (ref 8.4–10.5)
Chloride: 103 mEq/L (ref 96–112)
Creatinine, Ser: 0.82 mg/dL (ref 0.40–1.20)
GFR: 80.06 mL/min (ref >60.00)
GLUCOSE: 92 mg/dL (ref 70–99)
Potassium: 4.2 mEq/L (ref 3.5–5.1)
SODIUM: 137 meq/L (ref 135–145)
TOTAL PROTEIN: 7.4 g/dL (ref 6.0–8.3)

## 2017-10-26 LAB — CBC WITH DIFFERENTIAL/PLATELET
BASOS ABS: 0.1 10*3/uL (ref 0.0–0.1)
Basophils Relative: 1 % (ref 0.0–3.0)
EOS ABS: 0.2 10*3/uL (ref 0.0–0.7)
EOS PCT: 2.3 % (ref 0.0–5.0)
HCT: 41.5 % (ref 36.0–46.0)
HEMOGLOBIN: 13.8 g/dL (ref 12.0–15.0)
Lymphocytes Relative: 21.6 % (ref 12.0–46.0)
Lymphs Abs: 1.9 10*3/uL (ref 0.7–4.0)
MCHC: 33.3 g/dL (ref 30.0–36.0)
MCV: 83.2 fl (ref 78.0–100.0)
MONO ABS: 0.4 10*3/uL (ref 0.1–1.0)
Monocytes Relative: 4.6 % (ref 3.0–12.0)
Neutro Abs: 6.1 10*3/uL (ref 1.4–7.7)
Neutrophils Relative %: 70.5 % (ref 43.0–77.0)
Platelets: 341 10*3/uL (ref 150.0–400.0)
RBC: 4.98 Mil/uL (ref 3.87–5.11)
RDW: 14.5 % (ref 11.5–15.5)
WBC: 8.7 10*3/uL (ref 4.0–10.5)

## 2017-10-26 LAB — SEDIMENTATION RATE: Sed Rate: 21 mm/hr — ABNORMAL HIGH (ref 0–20)

## 2017-10-26 MED ORDER — KETOROLAC TROMETHAMINE 60 MG/2ML IM SOLN
60.0000 mg | Freq: Once | INTRAMUSCULAR | Status: AC
  Administered 2017-10-26: 60 mg via INTRAMUSCULAR

## 2017-10-26 NOTE — Assessment & Plan Note (Signed)
MRI reviewed with pt To to see opth this am at 1030

## 2017-10-26 NOTE — Addendum Note (Signed)
Addended by: Kem Boroughs D on: 10/26/2017 05:13 PM   Modules accepted: Orders

## 2017-10-26 NOTE — Progress Notes (Signed)
Patient ID: Candice Oconnor, female   DOB: Feb 06, 1971, 47 y.o.   MRN: [id number]     Subjective:  I acted as a Education administrator for Dr. Carollee Herter.  Guerry Bruin, CMA f  Patient ID: Candice Oconnor, female    DOB: 06-Oct-1971, 47 y.o.   MRN: [id number]  Chief Complaint  Patient presents with  . Follow-up    headache    HPI  Patient is in today for follow up headache.  She is now having right sided eye pain and right sided jaw pain.   Headaches have been becoming more frequent   This am the headache woke her up and she had R eye and jaw pain.  Imitrex normally helps but within the past month it has stopped  Patient Care Team: Shelda Pal, DO as PCP - General (Family Medicine)   Past Medical History:  Diagnosis Date  . Chicken pox   . HPV in female   . Hyperlipidemia   . Migraine     Past Surgical History:  Procedure Laterality Date  . NO PAST SURGERIES      Family History  Problem Relation Age of Onset  . Heart disease Mother   . Diabetes Mother   . Heart disease Father   . Diabetes Father   . Breast cancer Neg Hx     Social History   Socioeconomic History  . Marital status: Divorced    Spouse name: Not on file  . Number of children: Not on file  . Years of education: Not on file  . Highest education level: Not on file  Social Needs  . Financial resource strain: Not on file  . Food insecurity - worry: Not on file  . Food insecurity - inability: Not on file  . Transportation needs - medical: Not on file  . Transportation needs - non-medical: Not on file  Occupational History  . Not on file  Tobacco Use  . Smoking status: Never Smoker  . Smokeless tobacco: Never Used  Substance and Sexual Activity  . Alcohol use: No  . Drug use: No  . Sexual activity: Not Currently    Partners: Male  Other Topics Concern  . Not on file  Social History Narrative  . Not on file    Outpatient Medications Prior to Visit  Medication Sig Dispense Refill  . albuterol  (PROVENTIL HFA;VENTOLIN HFA) 108 (90 BASE) MCG/ACT inhaler Inhale 1-2 puffs into the lungs every 6 (six) hours as needed for wheezing or shortness of breath.    Marland Kitchen azelastine (ASTELIN) 0.1 % nasal spray Place 2 sprays into both nostrils 2 (two) times daily. Use in each nostril as directed 30 mL 12  . Cholecalciferol (VITAMIN D3) 5000 units TABS Take 1 tab daily. 30 tablet   . montelukast (SINGULAIR) 10 MG tablet Take 10 mg by mouth at bedtime.    . propranolol (INDERAL) 20 MG tablet Take 1 tablet (20 mg total) by mouth 3 (three) times daily. 90 tablet 1  . SUMAtriptan (IMITREX) 100 MG tablet Take 100 mg by mouth every 2 (two) hours as needed for migraine. May repeat in 2 hours if headache persists or recurs.    . norethindrone (ORTHO MICRONOR) 0.35 MG tablet Take 1 tablet (0.35 mg total) by mouth daily. 1 Package 11   No facility-administered medications prior to visit.     Allergies  Allergen Reactions  . 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  HENT: Negative for congestion.   Eyes: Positive for blurred vision. Negative for photophobia.  Respiratory: Negative for shortness of breath.   Cardiovascular: Negative for chest pain, palpitations and leg swelling.  Gastrointestinal: Negative for abdominal pain, blood in stool and nausea.  Genitourinary: Negative for dysuria and frequency.  Musculoskeletal: Negative for falls.  Skin: Negative for rash.  Neurological: Positive for headaches. Negative for dizziness and loss of consciousness.  Endo/Heme/Allergies: Negative for environmental allergies.  Psychiatric/Behavioral: Negative for depression. The patient is not nervous/anxious.        Objective:    Physical Exam  Constitutional: She is oriented to person, place, and time. She appears well-developed and well-nourished.  HENT:  Head: Normocephalic and atraumatic.  Eyes:  Conjunctivae and EOM are normal.  Neck: Normal range of motion. Neck supple. No JVD present. Carotid bruit is not present. No thyromegaly present.  Cardiovascular: Normal rate, regular rhythm and normal heart sounds.  No murmur heard. Pulmonary/Chest: Effort normal and breath sounds normal. No respiratory distress. She has no wheezes. She has no rales. She exhibits no tenderness.  Musculoskeletal: She exhibits no edema.  Neurological: She is alert and oriented to person, place, and time. She displays normal reflexes. No cranial nerve deficit. She exhibits normal muscle tone. Coordination normal.  Psychiatric: She has a normal mood and affect.  Nursing note and vitals reviewed.   BP 116/76 (BP Location: Left Arm, Cuff Size: Large)   Pulse 72   Temp 98.2 F (36.8 C) (Oral)   Resp 16   Ht 5\' 3"  (1.6 m)   Wt 230 lb 6.4 oz (104.5 kg)   LMP 10/23/2017   SpO2 97%   BMI 40.81 kg/m  Wt Readings from Last 3 Encounters:  10/26/17 230 lb 6.4 oz (104.5 kg)  10/21/17 232 lb (105.2 kg)  08/31/17 234 lb 8 oz (106.4 kg)   BP Readings from Last 3 Encounters:  10/26/17 116/76  10/21/17 110/74  08/31/17 (!) 109/58      There is no immunization history on file for this patient.  Health Maintenance  Topic Date Due  . HIV Screening  08/19/1987  . TETANUS/TDAP  08/19/1991  . PAP SMEAR  10/26/2015  . INFLUENZA VACCINE  05/06/2017    Lab Results  Component Value Date   WBC 10.3 09/15/2014   HGB 12.8 09/15/2014   HCT 38.7 09/15/2014   PLT 266 09/15/2014   GLUCOSE 101 (H) 09/15/2014   ALT 15 09/15/2014   AST 13 09/15/2014   NA 140 09/15/2014   K 4.2 09/15/2014   CL 102 09/15/2014   CREATININE 0.68 09/15/2014   BUN 8 09/15/2014   CO2 23 09/15/2014    No results found for: TSH Lab Results  Component Value Date   WBC 10.3 09/15/2014   HGB 12.8 09/15/2014   HCT 38.7 09/15/2014   MCV 83.9 09/15/2014   PLT 266 09/15/2014   Lab Results  Component Value Date   NA 140 09/15/2014    K 4.2 09/15/2014   CO2 23 09/15/2014   GLUCOSE 101 (H) 09/15/2014   BUN 8 09/15/2014   CREATININE 0.68 09/15/2014   BILITOT 0.2 (L) 09/15/2014   ALKPHOS 78 09/15/2014   AST 13 09/15/2014   ALT 15 09/15/2014   PROT 7.7 09/15/2014   ALBUMIN 3.7 09/15/2014   CALCIUM 9.4 09/15/2014   ANIONGAP 15 09/15/2014   No results found for: CHOL No results found for: HDL  No results found for: LDLCALC No results found for: TRIG No results found for: CHOLHDL No results found for: HGBA1C    CLINICAL DATA:  46 year old female with frontal and periorbital headaches for 2-3 months associated with nausea and vision disturbance. Limited relief with over-the-counter medication.  EXAM: MRI HEAD WITHOUT CONTRAST  TECHNIQUE: Multiplanar, multiecho pulse sequences of the brain and surrounding structures were obtained without intravenous contrast.  COMPARISON:  None.  FINDINGS: Brain: Normal cerebral volume.  No restricted diffusion to suggest acute infarction. No midline shift, mass effect, evidence of mass lesion, ventriculomegaly, extra-axial collection or acute intracranial hemorrhage. Cervicomedullary junction and pituitary are within normal limits.  Pearline Cables and white matter signal is within normal limits throughout the brain. No encephalomalacia or chronic cerebral blood products.  Vascular: Major intracranial vascular flow voids are preserved.  Skull and upper cervical spine: Negative visualized cervical spine. Visualized bone marrow signal is within normal limits.  Sinuses/Orbits: Mildly disconjugate gaze, otherwise normal orbit soft tissues. Paranasal sinuses are clear.  Other: Mastoid air cells are clear. Visible internal auditory structures appear normal. Scalp and face soft tissues appear negative.  IMPRESSION: Normal noncontrast MRI appearance of the brain.   Electronically Signed   By: Genevie Ann M.D.   On: 10/24/2017 14:30    Assessment & Plan:   Problem  List Items Addressed This Visit      Unprioritized   Blurry vision, bilateral    MRI reviewed with pt To to see opth this am at 1030      Relevant Orders   Ambulatory referral to Ophthalmology   Frequent headaches - Primary    Check labs con't propranolol toradol shot given and pt did have some relief after 10-15 min F/u as previously scheduled      Relevant Orders   CBC with Differential/Platelet   Comprehensive metabolic panel   Sedimentation rate   Ambulatory referral to Ophthalmology      I am having Annaliesa A. Stalker maintain her norethindrone, albuterol, SUMAtriptan, montelukast, azelastine, Vitamin D3, and propranolol.  No orders of the defined types were placed in this encounter.   CMA served as Education administrator during this visit. History, Physical and Plan performed by medical provider. Documentation and orders reviewed and attested to.  Ann Held, DO .

## 2017-10-26 NOTE — Assessment & Plan Note (Signed)
Check labs con't propranolol toradol shot given and pt did have some relief after 10-15 min F/u as previously scheduled

## 2017-10-26 NOTE — Patient Instructions (Addendum)
You have an appointment at your eye doctor today at 1030am.  General Headache Without Cause A headache is pain or discomfort felt around the head or neck area. There are many causes and types of headaches. In some cases, the cause may not be found. Follow these instructions at home: Managing pain  Take over-the-counter and prescription medicines only as told by your doctor.  Lie down in a dark, quiet room when you have a headache.  If directed, apply ice to the head and neck area: ? Put ice in a plastic bag. ? Place a towel between your skin and the bag. ? Leave the ice on for 20 minutes, 2-3 times per day.  Use a heating pad or hot shower to apply heat to the head and neck area as told by your doctor.  Keep lights dim if bright lights bother you or make your headaches worse. Eating and drinking  Eat meals on a regular schedule.  Lessen how much alcohol you drink.  Lessen how much caffeine you drink, or stop drinking caffeine. General instructions  Keep all follow-up visits as told by your doctor. This is important.  Keep a journal to find out if certain things bring on headaches. For example, write down: ? What you eat and drink. ? How much sleep you get. ? Any change to your diet or medicines.  Relax by getting a massage or doing other relaxing activities.  Lessen stress.  Sit up straight. Do not tighten (tense) your muscles.  Do not use tobacco products. This includes cigarettes, chewing tobacco, or e-cigarettes. If you need help quitting, ask your doctor.  Exercise regularly as told by your doctor.  Get enough sleep. This often means 7-9 hours of sleep. Contact a doctor if:  Your symptoms are not helped by medicine.  You have a headache that feels different than the other headaches.  You feel sick to your stomach (nauseous) or you throw up (vomit).  You have a fever. Get help right away if:  Your headache becomes really bad.  You keep throwing up.  You  have a stiff neck.  You have trouble seeing.  You have trouble speaking.  You have pain in the eye or ear.  Your muscles are weak or you lose muscle control.  You lose your balance or have trouble walking.  You feel like you will pass out (faint) or you pass out.  You have confusion. This information is not intended to replace advice given to you by your health care provider. Make sure you discuss any questions you have with your health care provider. Document Released: 07/01/2008 Document Revised: 02/28/2016 Document Reviewed: 01/15/2015 Elsevier Interactive Patient Education  [employer].

## 2017-10-27 DIAGNOSIS — R51 Headache: Secondary | ICD-10-CM | POA: Diagnosis not present

## 2017-10-29 ENCOUNTER — Telehealth: Payer: Self-pay | Admitting: Family Medicine

## 2017-10-29 DIAGNOSIS — H539 Unspecified visual disturbance: Secondary | ICD-10-CM

## 2017-10-29 DIAGNOSIS — G4489 Other headache syndrome: Secondary | ICD-10-CM

## 2017-10-29 NOTE — Telephone Encounter (Signed)
Copied from [place] [id number]. Topic: Quick Communication - See Telephone Encounter >> Oct 29, 2017  4:34 PM Boyd Kerbs wrote: CRM for notification. See Telephone encounter for:   Patient went to eye doctor per referral nd they referred her to [place] [facility], who referred her to Neuro doctor,  but he was peds dr. Donell Sievert    She is asking for referral to Neurologist from [place].    10/29/17.

## 2017-10-30 NOTE — Telephone Encounter (Signed)
Referral done

## 2017-10-30 NOTE — Telephone Encounter (Signed)
OK to refer for HA and vision changes. TY.

## 2017-10-30 NOTE — Addendum Note (Signed)
Addended by: Sharon Seller B on: 10/30/2017 08:05 AM   Modules accepted: Orders

## 2017-11-03 ENCOUNTER — Encounter: Payer: Self-pay | Admitting: Neurology

## 2017-11-03 ENCOUNTER — Ambulatory Visit (INDEPENDENT_AMBULATORY_CARE_PROVIDER_SITE_OTHER): Payer: 59 | Admitting: Neurology

## 2017-11-03 VITALS — BP 114/73 | HR 78 | Ht 63.0 in | Wt 233.0 lb

## 2017-11-03 DIAGNOSIS — R51 Headache: Secondary | ICD-10-CM | POA: Diagnosis not present

## 2017-11-03 DIAGNOSIS — G932 Benign intracranial hypertension: Secondary | ICD-10-CM | POA: Diagnosis not present

## 2017-11-03 DIAGNOSIS — R519 Headache, unspecified: Secondary | ICD-10-CM

## 2017-11-03 MED ORDER — METHYLPREDNISOLONE 4 MG PO TBPK: ORAL_TABLET | ORAL | 1 refills | Status: DC

## 2017-11-03 NOTE — Progress Notes (Signed)
[facility]    Provider:  Dr Jaynee Eagles Referring Provider: Shelda Pal* Primary Care Physician:  Shelda Pal, DO  CC:  Migraines  HPI:  Candice Oconnor is a 47 y.o. female here as a referral from Dr. Nani Ravens for migraines. PMHx migraine.  Headaches started in her early 47s, were triggered by stress and poor eating. She would have them occasionally. For the past 3 months, headaches have been more frequent and now she has daily headaches. Imitrex doesn;t help. On the 21st, eye pain woke her up in the middle of the night. She had an MRI brain, she saw her eye doctor, she saw ophthalmology and also her primary care. MRI brain was negative. Her migraines are in the frontal area and on the top of her head on left, light sensitivity, nausea, pulsating like a heart beat and sharp pains, constant. A wet cloth and laying in the dark help, they can last 24 hours. She has other headaches, more pressure on the right side of the head, constant dull, she has mild continuous light sensitivity and some mild nausea. Can wax and wane but it has been continuous since the 21st and right eye pain. She has tried imitrex, ibuprofen, she was started on propranolol and is feeling better. Sister has migraines. No medication overuse. She is aware of rebound headaches. Vision is worsening but she denies diplopia, vision is blurry, she feels like her ears are full, lots of pressure, No other focal neurologic deficits, associated symptoms, inciting events or modifiable factors.  Reviewed notes, labs and imaging from outside physicians, which showed:  Personally reviewed MRI images, normal  Cbc/cmp normal  Review of Systems: Patient complains of symptoms per HPI as well as the following symptoms: confusion, headache, insomnia, restless legs. . Pertinent negatives and positives per HPI. All others negative.   Social History   Socioeconomic History  . Marital status: Divorced   Spouse name: Not on file  . Number of children: 1  . Years of education: Not on file  . Highest education level: Not on file  Social Needs  . Financial resource strain: Not on file  . Food insecurity - worry: Not on file  . Food insecurity - inability: Not on file  . Transportation needs - medical: Not on file  . Transportation needs - non-medical: Not on file  Occupational History  . Not on file  Tobacco Use  . Smoking status: Never Smoker  . Smokeless tobacco: Never Used  Substance and Sexual Activity  . Alcohol use: No  . 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 Sexual activity: Not Currently    Partners: Male  Other Topics Concern  . Not on file  Social History Narrative   Lives at home with her child   Right handed   Drinks 1 cup of caffeine daily    Family History  Problem Relation Age of Onset  . Heart disease Mother   . Diabetes Mother   . Heart disease Father   . Diabetes Father   . Breast cancer Neg Hx     Past Medical History:  Diagnosis Date  . Chicken pox   . HPV in female   . Hyperlipidemia   . Migraine     Past Surgical History:  Procedure Laterality Date  . NO PAST SURGERIES      Current Outpatient Medications  Medication Sig Dispense Refill  . propranolol (INDERAL) 20 MG tablet Take 1  tablet (20 mg total) by mouth 3 (three) times daily. 90 tablet 1  . albuterol (PROVENTIL HFA;VENTOLIN HFA) 108 (90 BASE) MCG/ACT inhaler Inhale 1-2 puffs into the lungs every 6 (six) hours as needed for wheezing or shortness of breath.    Marland Kitchen azelastine (ASTELIN) 0.1 % nasal spray Place 2 sprays into both nostrils 2 (two) times daily. Use in each nostril as directed 30 mL 12  . methylPREDNISolone (MEDROL DOSEPAK) 4 MG TBPK tablet follow package directions 21 tablet 1  . montelukast (SINGULAIR) 10 MG tablet Take 10 mg by mouth at bedtime.    . SUMAtriptan (IMITREX) 100 MG tablet Take 100 mg by mouth every 2 (two) hours as needed for  migraine. May repeat in 2 hours if headache persists or recurs.     No current facility-administered medications for this visit.     Allergies as of 11/03/2017 - Review Complete 11/03/2017  Allergen Reaction Noted  . Other Swelling 06/21/2014    Vitals: BP 114/73 (BP Location: Right Arm, Patient Position: Sitting)   Pulse 78   Ht 5\' 3"  (1.6 m)   Wt 233 lb (105.7 kg)   LMP 10/23/2017   BMI 41.27 kg/m  Last Weight:  Wt Readings from Last 1 Encounters:  11/03/17 233 lb (105.7 kg)   Last Height:   Ht Readings from Last 1 Encounters:  11/03/17 5\' 3"  (1.6 m)    Physical exam: Exam: Gen: NAD, conversant, well nourised, obese, well groomed                     CV: RRR, no MRG. No Carotid Bruits. No peripheral edema, warm, nontender Eyes: Conjunctivae clear without exudates or hemorrhage  Neuro: Detailed Neurologic Exam  Speech:    Speech is normal; fluent and spontaneous with normal comprehension.  Cognition:    The patient is oriented to person, place, and time;     recent and remote memory intact;     language fluent;     normal attention, concentration,     fund of knowledge Cranial Nerves:    The pupils are equal, round, and reactive to light. The fundi are normal and spontaneous venous pulsations are present. Visual fields are full to finger confrontation. Extraocular movements are intact. Trigeminal sensation is intact and the muscles of mastication are normal. The face is symmetric. The palate elevates in the midline. Hearing intact. Voice is normal. Shoulder shrug is normal. The tongue has normal motion without fasciculations.   Coordination:    Normal finger to nose and heel to shin. Normal rapid alternating movements.   Gait:    Heel-toe and tandem gait are normal.   Motor Observation:    No asymmetry, no atrophy, and no involuntary movements noted. Tone:    Normal muscle tone.    Posture:    Posture is normal. normal erect    Strength:    Strength is  V/V in the upper and lower limbs.      Sensation: intact to LT     Reflex Exam:  DTR's:    Deep tendon reflexes in the upper and lower extremities are normal bilaterally.   Toes:    The toes are downgoing bilaterally.   Clonus:    Clonus is absent.      Assessment/Plan:  47 year old with progressively worsening headache, daily, more pressure quality. She has a PMHx of migraines, obesity. May be status migrainosus and will try and treat with a taper of steroids but  suspicious of IIH will order lumbar puncture.   Lumbar Puncture for opening pressure to evaluate for IIH Steroid taper for migraine pain and worsening headache for 8 days Also need to consider Sleep Apnea which can cause worsening intractable headaches however ESS was <10 Will treat with Diamox if pressure is high or Topiramate. Continue propranolol. Healthy Weight and [place] [facility] for [facility], highly recommend  Orders Placed This Encounter  Procedures  . DG FLUORO GUIDED LOC OF NEEDLE/CATH TIP FOR SPINAL INJECT RT     Discussed: To prevent or relieve headaches, try the following: Cool Compress. Lie down and place a cool compress on your head.  Avoid headache triggers. If certain foods or odors seem to have triggered your migraines in the past, avoid them. A headache diary might help you identify triggers.  Include physical activity in your daily routine. Try a daily walk or other moderate aerobic exercise.  Manage stress. Find healthy ways to cope with the stressors, such as delegating tasks on your to-do list.  Practice relaxation techniques. Try deep breathing, yoga, massage and visualization.  Eat regularly. Eating regularly scheduled meals and maintaining a healthy diet might help prevent headaches. Also, drink plenty of fluids.  Follow a regular sleep schedule. Sleep deprivation might contribute to headaches Consider biofeedback. With this mind-body technique, you learn to control certain bodily  functions - such as muscle tension, heart rate and blood pressure - to prevent headaches or reduce headache pain.    Proceed to emergency room if you experience new or worsening symptoms or symptoms do not resolve, if you have new neurologic symptoms or if headache is severe, or for any concerning symptom.   Provided education and documentation from American headache Society toolbox including articles on: chronic migraine medication overuse headache, chronic migraines, prevention of migraines, behavioral and other nonpharmacologic treatments for headache.   Sarina Ill, MD  [facility] [address] [place], [place] [address]  Phone [phone] Fax [phone]

## 2017-11-03 NOTE — Progress Notes (Signed)
Epworth Sleepiness Scale 0= would never doze 1= slight chance of dozing 2= moderate chance of dozing 3= high chance of dozing  Sitting and reading: 1 Watching TV: 0 Sitting inactive in a public place (ex. Theater or meeting): 0 As a passenger in a car for an hour without a break: 0 Lying down to rest in the afternoon: 2 Sitting and talking to someone: 0 Sitting quietly after lunch (no alcohol): 0 In a car, while stopped in traffic: 0 Total: 3

## 2017-11-03 NOTE — Patient Instructions (Addendum)
Could be migraines or possibly idiopathic Intracranial HTN.  Steroid for 6 days Lumbar puncture Then treat with Acetazolamide or Topiramate   Idiopathic Intracranial Hypertension Idiopathic intracranial hypertension (IIH) is a condition that increases pressure around the brain. The fluid that surrounds the brain and spinal cord (cerebrospinal fluid, CSF) increases and causes the pressure. Idiopathic means that the cause of this condition is not known. IIH affects the brain and spinal cord (is a neurological disorder). If this condition is not treated, it can cause vision loss or blindness. What increases the risk? You are more likely to develop this condition if:  You are severely overweight (obese).  You are a woman who has not gone through menopause.  You take certain medicines, such as birth control or steroids.  What are the signs or symptoms? Symptoms of IIH include:  Headaches. This is the most common symptom.  Pain in the shoulders or neck.  Nausea and vomiting.  A "rushing water" or pulsing sound within the ears (pulsatile tinnitus).  Double vision.  Blurred vision.  Brief episodes of complete vision loss.  How is this diagnosed? This condition may be diagnosed based on:  Your symptoms.  Your medical history.  CT scan of the brain.  MRI of the brain.  Magnetic resonance venogram (MRV) to check veins in the brain.  Diagnostic lumbar puncture. This is a procedure to remove and examine a sample of cerebrospinal fluid. This procedure can determine whether too much fluid may be causing IIH.  A thorough eye exam to check for swelling or nerve damage in the eyes.  How is this treated? Treatment for this condition depends on your symptoms. The goal of treatment is to decrease the pressure around your brain. Common treatments include:  Medicines to decrease the production of spinal fluid and lower the pressure within your skull.  Medicines to prevent or treat  headaches.  Surgery to place drains (shunts) in your brain to remove excess fluid.  Lumbar puncture to remove excess cerebrospinal fluid.  Follow these instructions at home:  If you are overweight or obese, work with your health care provider to lose weight.  Take over-the-counter and prescription medicines only as told by your health care provider.  Do not drive or use heavy machinery while taking medicines that can make you sleepy.  Keep all follow-up visits as told by your health care provider. This is important. Contact a health care provider if:  You have changes in your vision, such as: ? Double vision. ? Not being able to see colors (color vision). Get help right away if:  You have any of the following symptoms and they get worse or do not get better. ? Headaches. ? Nausea. ? Vomiting. ? Vision changes or difficulty seeing. Summary  Idiopathic intracranial hypertension (IIH) is a condition that increases pressure around the brain. The cause is not known (is idiopathic).  The most common symptom of IIH is headaches.  Treatment may include medicines or surgery to relieve the pressure on your brain. This information is not intended to replace advice given to you by your health care provider. Make sure you discuss any questions you have with your health care provider. Document Released: 12/01/2001 Document Revised: 08/13/2016 Document Reviewed: 08/13/2016 Elsevier Interactive Patient Education  2017 [employer].   Migraine Headache A migraine headache is an intense, throbbing pain on one side or both sides of the head. Migraines may also cause other symptoms, such as nausea, vomiting, and sensitivity to light and  noise. What are the causes? Doing or taking certain things may also trigger migraines, such as:  Alcohol.  Smoking.  Medicines, such as: ? Medicine used to treat chest pain (nitroglycerine). ? Birth control pills. ? Estrogen pills. ? Certain blood  pressure medicines.  Aged cheeses, chocolate, or caffeine.  Foods or drinks that contain nitrates, glutamate, aspartame, or tyramine.  Physical activity.  Other things that may trigger a migraine include:  Menstruation.  Pregnancy.  Hunger.  Stress, lack of sleep, too much sleep, or fatigue.  Weather changes.  What increases the risk? The following factors may make you more likely to experience migraine headaches:  Age. Risk increases with age.  Family history of migraine headaches.  Being Caucasian.  Depression and anxiety.  Obesity.  Being a woman.  Having a hole in the heart (patent foramen ovale) or other heart problems.  What are the signs or symptoms? The main symptom of this condition is pulsating or throbbing pain. Pain may:  Happen in any area of the head, such as on one side or both sides.  Interfere with daily activities.  Get worse with physical activity.  Get worse with exposure to bright lights or loud noises.  Other symptoms may include:  Nausea.  Vomiting.  Dizziness.  General sensitivity to bright lights, loud noises, or smells.  Before you get a migraine, you may get warning signs that a migraine is developing (aura). An aura may include:  Seeing flashing lights or having blind spots.  Seeing bright spots, halos, or zigzag lines.  Having tunnel vision or blurred vision.  Having numbness or a tingling feeling.  Having trouble talking.  Having muscle weakness.  How is this diagnosed? A migraine headache can be diagnosed based on:  Your symptoms.  A physical exam.  Tests, such as CT scan or MRI of the head. These imaging tests can help rule out other causes of headaches.  Taking fluid from the spine (lumbar puncture) and analyzing it (cerebrospinal fluid analysis, or CSF analysis).  How is this treated? A migraine headache is usually treated with medicines that:  Relieve pain.  Relieve nausea.  Prevent migraines  from coming back.  Treatment may also include:  Acupuncture.  Lifestyle changes like avoiding foods that trigger migraines.  Follow these instructions at home: Medicines  Take over-the-counter and prescription medicines only as told by your health care provider.  Do not drive or use heavy machinery while taking prescription pain medicine.  To prevent or treat constipation while you are taking prescription pain medicine, your health care provider may recommend that you: ? Drink enough fluid to keep your urine clear or pale yellow. ? Take over-the-counter or prescription medicines. ? Eat foods that are high in fiber, such as fresh fruits and vegetables, whole grains, and beans. ? Limit foods that are high in fat and processed sugars, such as fried and sweet foods. Lifestyle  Avoid alcohol use.  Do not use any products that contain nicotine or tobacco, such as cigarettes and e-cigarettes. If you need help quitting, ask your health care provider.  Get at least 8 hours of sleep every night.  Limit your stress. General instructions   Keep a journal to find out what may trigger your migraine headaches. For example, write down: ? What you eat and drink. ? How much sleep you get. ? Any change to your diet or medicines.  If you have a migraine: ? Avoid things that make your symptoms worse, such as bright lights. ?  It may help to lie down in a dark, quiet room. ? Do not drive or use heavy machinery. ? Ask your health care provider what activities are safe for you while you are experiencing symptoms.  Keep all follow-up visits as told by your health care provider. This is important. Contact a health care provider if:  You develop symptoms that are different or more severe than your usual migraine symptoms. Get help right away if:  Your migraine becomes severe.  You have a fever.  You have a stiff neck.  You have vision loss.  Your muscles feel weak or like you cannot  control them.  You start to lose your balance often.  You develop trouble walking.  You faint. This information is not intended to replace advice given to you by your health care provider. Make sure you discuss any questions you have with your health care provider. Document Released: 09/22/2005 Document Revised: 04/11/2016 Document Reviewed: 03/10/2016 Elsevier Interactive Patient Education  2017 [employer]  Acetazolamide tablets What is this medicine? ACETAZOLAMIDE (a set a ZOLE a mide) is used to treat glaucoma and some seizure disorders. It may be used to treat edema or swelling from heart failure or from other medicines. This medicine is also used to treat and to prevent altitude or mountain sickness. This medicine may be used for other purposes; ask your health care provider or pharmacist if you have questions. COMMON BRAND NAME(S): Diamox What should I tell my health care provider before I take this medicine? They need to know if you have any of these conditions: -diabetes -kidney disease -liver disease -lung disease -an unusual or allergic reaction to acetazolamide, sulfa drugs, other medicines, foods, dyes, or preservatives -pregnant or trying to get pregnant -breast-feeding How should I use this medicine? Take this medicine by mouth with a glass of water. Follow the directions on the prescription label. Take this medicine with food if it upsets your stomach. Take your doses at regular intervals. Do not take your medicine more often than directed. Do not stop taking except on your doctor's advice. Talk to your pediatrician regarding the use of this medicine in children. Special care may be needed. Patients over 17 years old may have a stronger reaction and need a smaller dose. Overdosage: If you think you have taken too much of this medicine contact a poison control center or emergency room at once. NOTE: This medicine is only for you. Do not share this medicine with  others. What if I miss a dose? If you miss a dose, take it as soon as you can. If it is almost time for your next dose, take only that dose. Do not take double or extra doses. What may interact with this medicine? Do not take this medicine with any of the following medications: -methazolamide This medicine may also interact with the following medications: -aspirin and aspirin-like medicines -cyclosporine -lithium -medicine for diabetes -methenamine -other diuretics -phenytoin -primidone -quinidine -sodium bicarbonate -stimulant medicines like dextroamphetamine This list may not describe all possible interactions. Give your health care provider a list of all the medicines, herbs, non-prescription drugs, or dietary supplements you use. Also tell them if you smoke, drink alcohol, or use illegal drugs. Some items may interact with your medicine. What should I watch for while using this medicine? Visit your doctor or health care professional for regular checks on your progress. You will need blood work done regularly. If you are diabetic, check your blood sugar as directed. You may  need to be on a special diet while taking this medicine. Ask your doctor. Also, ask how many glasses of fluid you need to drink a day. You must not get dehydrated. You may get drowsy or dizzy. Do not drive, use machinery, or do anything that needs mental alertness until you know how this medicine affects you. Do not stand or sit up quickly, especially if you are an older patient. This reduces the risk of dizzy or fainting spells. This medicine can make you more sensitive to the sun. Keep out of the sun. If you cannot avoid being in the sun, wear protective clothing and use sunscreen. Do not use sun lamps or tanning beds/booths. What side effects may I notice from receiving this medicine? Side effects that you should report to your doctor or health care professional as soon as possible: -allergic reactions like skin  rash, itching or hives, swelling of the face, lips, or tongue -breathing problems -confusion, depression -dark urine -fever -numbness, tingling in hands or feet -redness, blistering, peeling or loosening of the skin, including inside the mouth -ringing in the ears -seizures -unusually weak or tired -yellowing of the eyes or skin Side effects that usually do not require medical attention (report to your doctor or health care professional if they continue or are bothersome): -change in taste -diarrhea -headache -loss of appetite -nausea, vomiting -passing urine more often This list may not describe all possible side effects. Call your doctor for medical advice about side effects. You may report side effects to FDA at 1-800-FDA-1088. Where should I keep my medicine? Keep out of the reach of children. Store at room temperature between 20 and 25 degrees C (68 and 77 degrees F). Throw away any unused medicine after the expiration date. NOTE: This sheet is a summary. It may not cover all possible information. If you have questions about this medicine, talk to your doctor, pharmacist, or health care provider.  2018 Elsevier/Gold Standard (2007-12-15 10:59:40)   Topiramate tablets What is this medicine? TOPIRAMATE (toe PYRE a mate) is used to treat seizures in adults or children with epilepsy. It is also used for the prevention of migraine headaches. This medicine may be used for other purposes; ask your health care provider or pharmacist if you have questions. COMMON BRAND NAME(S): Topamax, Topiragen What should I tell my health care provider before I take this medicine? They need to know if you have any of these conditions: -bleeding disorders -cirrhosis of the liver or liver disease -diarrhea -glaucoma -kidney stones or kidney disease -low blood counts, like low white cell, platelet, or red cell counts -lung disease like asthma, obstructive pulmonary disease, emphysema -metabolic  acidosis -on a ketogenic diet -schedule for surgery or a procedure -suicidal thoughts, plans, or attempt; a previous suicide attempt by you or a family member -an unusual or allergic reaction to topiramate, other medicines, foods, dyes, or preservatives -pregnant or trying to get pregnant -breast-feeding How should I use this medicine? Take this medicine by mouth with a glass of water. Follow the directions on the prescription label. Do not crush or chew. You may take this medicine with meals. Take your medicine at regular intervals. Do not take it more often than directed. Talk to your pediatrician regarding the use of this medicine in children. Special care may be needed. While this drug may be prescribed for children as young as 40 years of age for selected conditions, precautions do apply. Overdosage: If you think you have taken too much of  this medicine contact a poison control center or emergency room at once. NOTE: This medicine is only for you. Do not share this medicine with others. What if I miss a dose? If you miss a dose, take it as soon as you can. If your next dose is to be taken in less than 6 hours, then do not take the missed dose. Take the next dose at your regular time. Do not take double or extra doses. What may interact with this medicine? Do not take this medicine with any of the following medications: -probenecid This medicine may also interact with the following medications: -acetazolamide -alcohol -amitriptyline -aspirin and aspirin-like medicines -birth control pills -certain medicines for depression -certain medicines for seizures -certain medicines that treat or prevent blood clots like warfarin, enoxaparin, dalteparin, apixaban, dabigatran, and rivaroxaban -digoxin -hydrochlorothiazide -lithium -medicines for pain, sleep, or muscle relaxation -metformin -methazolamide -NSAIDS, medicines for pain and inflammation, like ibuprofen or  naproxen -pioglitazone -risperidone This list may not describe all possible interactions. Give your health care provider a list of all the medicines, herbs, non-prescription drugs, or dietary supplements you use. Also tell them if you smoke, drink alcohol, or use illegal drugs. Some items may interact with your medicine. What should I watch for while using this medicine? Visit your doctor or health care professional for regular checks on your progress. Do not stop taking this medicine suddenly. This increases the risk of seizures if you are using this medicine to control epilepsy. Wear a medical identification bracelet or chain to say you have epilepsy or seizures, and carry a card that lists all your medicines. This medicine can decrease sweating and increase your body temperature. Watch for signs of deceased sweating or fever, especially in children. Avoid extreme heat, hot baths, and saunas. Be careful about exercising, especially in hot weather. Contact your health care provider right away if you notice a fever or decrease in sweating. You should drink plenty of fluids while taking this medicine. If you have had kidney stones in the past, this will help to reduce your chances of forming kidney stones. If you have stomach pain, with nausea or vomiting and yellowing of your eyes or skin, call your doctor immediately. You may get drowsy, dizzy, or have blurred vision. Do not drive, use machinery, or do anything that needs mental alertness until you know how this medicine affects you. To reduce dizziness, do not sit or stand up quickly, especially if you are an older patient. Alcohol can increase drowsiness and dizziness. Avoid alcoholic drinks. If you notice blurred vision, eye pain, or other eye problems, seek medical attention at once for an eye exam. The use of this medicine may increase the chance of suicidal thoughts or actions. Pay special attention to how you are responding while on this medicine.  Any worsening of mood, or thoughts of suicide or dying should be reported to your health care professional right away. This medicine may increase the chance of developing metabolic acidosis. If left untreated, this can cause kidney stones, bone disease, or slowed growth in children. Symptoms include breathing fast, fatigue, loss of appetite, irregular heartbeat, or loss of consciousness. Call your doctor immediately if you experience any of these side effects. Also, tell your doctor about any surgery you plan on having while taking this medicine since this may increase your risk for metabolic acidosis. Birth control pills may not work properly while you are taking this medicine. Talk to your doctor about using an extra method of  birth control. Women who become pregnant while using this medicine may enroll in the [place] Pregnancy Registry by calling [phone]. This registry collects information about the safety of antiepileptic drug use during pregnancy. What side effects may I notice from receiving this medicine? Side effects that you should report to your doctor or health care professional as soon as possible: -allergic reactions like skin rash, itching or hives, swelling of the face, lips, or tongue -decreased sweating and/or rise in body temperature -depression -difficulty breathing, fast or irregular breathing patterns -difficulty speaking -difficulty walking or controlling muscle movements -hearing impairment -redness, blistering, peeling or loosening of the skin, including inside the mouth -tingling, pain or numbness in the hands or feet -unusual bleeding or bruising -unusually weak or tired -worsening of mood, thoughts or actions of suicide or dying Side effects that usually do not require medical attention (report to your doctor or health care professional if they continue or are bothersome): -altered taste -back pain, joint or muscle aches and  pains -diarrhea, or constipation -headache -loss of appetite -nausea -stomach upset, indigestion -tremors This list may not describe all possible side effects. Call your doctor for medical advice about side effects. You may report side effects to FDA at 1-800-FDA-1088. Where should I keep my medicine? Keep out of the reach of children. Store at room temperature between 15 and 30 degrees C (59 and 86 degrees F) in a tightly closed container. Protect from moisture. Throw away any unused medicine after the expiration date. NOTE: This sheet is a summary. It may not cover all possible information. If you have questions about this medicine, talk to your doctor, pharmacist, or health care provider.  2018 Elsevier/Gold Standard (2013-09-26 23:17:57)

## 2017-11-04 ENCOUNTER — Encounter (HOSPITAL_COMMUNITY): Payer: Self-pay

## 2017-11-04 ENCOUNTER — Other Ambulatory Visit: Payer: Self-pay

## 2017-11-04 ENCOUNTER — Emergency Department (HOSPITAL_COMMUNITY)
Admission: EM | Admit: 2017-11-04 | Discharge: 2017-11-04 | Disposition: A | Discharge status: Home / Self Care | Payer: 59 | Attending: Emergency Medicine | Admitting: Emergency Medicine

## 2017-11-04 DIAGNOSIS — Z79899 Other long term (current) drug therapy: Secondary | ICD-10-CM | POA: Diagnosis not present

## 2017-11-04 DIAGNOSIS — R51 Headache: Secondary | ICD-10-CM | POA: Diagnosis not present

## 2017-11-04 DIAGNOSIS — G43809 Other migraine, not intractable, without status migrainosus: Secondary | ICD-10-CM | POA: Diagnosis not present

## 2017-11-04 MED ORDER — SODIUM CHLORIDE 0.9 % IV BOLUS (SEPSIS)
1000.0000 mL | Freq: Once | INTRAVENOUS | Status: AC
  Administered 2017-11-04: 1000 mL via INTRAVENOUS

## 2017-11-04 MED ORDER — KETOROLAC TROMETHAMINE 30 MG/ML IJ SOLN
30.0000 mg | Freq: Once | INTRAMUSCULAR | Status: AC
  Administered 2017-11-04: 30 mg via INTRAVENOUS
  Filled 2017-11-04: qty 1

## 2017-11-04 MED ORDER — PROCHLORPERAZINE EDISYLATE 5 MG/ML IJ SOLN
10.0000 mg | Freq: Once | INTRAMUSCULAR | Status: AC
  Administered 2017-11-04: 10 mg via INTRAVENOUS
  Filled 2017-11-04: qty 2

## 2017-11-04 MED ORDER — MORPHINE SULFATE (PF) 4 MG/ML IV SOLN
4.0000 mg | Freq: Once | INTRAVENOUS | Status: AC
  Administered 2017-11-04: 4 mg via INTRAVENOUS
  Filled 2017-11-04: qty 1

## 2017-11-04 NOTE — ED Provider Notes (Signed)
[facility] EMERGENCY DEPARTMENT Provider Note   CSN: [id number] Arrival date & time: 11/04/17  0732     History   Chief Complaint No chief complaint on file.   HPI Candice Oconnor is a 47 y.o. female.  HPI Patient is a 47 year old female who presents emergency department with persistent headache.  She has seen her neurologist including yesterday.  She is on propranolol and has not had improvement in her symptoms.  She was also started on prednisone which she took her first dose today.  She had an MRI 3 weeks ago which demonstrate no significant abnormality.  She presents with persistent headache.  She is also seen her ophthalmologist and had her eyes evaluated in the past several weeks and those were found to be normal.  Denies nausea vomiting.  Worse with light.  No weakness of her arms or legs.  Symptoms are moderate in severity.   Past Medical History:  Diagnosis Date  . Chicken pox   . HPV in female   . Hyperlipidemia   . Migraine     Patient Active Problem List   Diagnosis Date Noted  . Blurry vision, bilateral 10/26/2017  . Frequent headaches 10/26/2017  . Mixed hyperlipidemia 08/31/2017  . Morbid obesity ([place]) 03/03/2012  . Heavy periods 03/03/2012  . Back pain 03/03/2012  . Urinary tract infection 03/03/2012    Past Surgical History:  Procedure Laterality Date  . NO PAST SURGERIES      OB History    Gravida Para Term Preterm AB Living   2 1           SAB TAB Ectopic Multiple Live Births                   Home Medications    Prior to Admission medications   Medication Sig Start Date End Date Taking? Authorizing Provider  albuterol (PROVENTIL HFA;VENTOLIN HFA) 108 (90 BASE) MCG/ACT inhaler Inhale 1-2 puffs into the lungs every 6 (six) hours as needed for wheezing or shortness of breath.   Yes [provider]  azelastine (ASTELIN) 0.1 % nasal spray Place 2 sprays into both nostrils 2 (two) times daily. Use in each nostril as  directed 09/07/17  Yes Wendling, Crosby Oyster, DO  methylPREDNISolone (MEDROL DOSEPAK) 4 MG TBPK tablet follow package directions 11/03/17  Yes Melvenia Beam, MD  montelukast (SINGULAIR) 10 MG tablet Take 10 mg by mouth at bedtime.   Yes [provider]  propranolol (INDERAL) 20 MG tablet Take 1 tablet (20 mg total) by mouth 3 (three) times daily. 10/21/17  Yes Shelda Pal, DO  SUMAtriptan (IMITREX) 100 MG tablet Take 100 mg by mouth every 2 (two) hours as needed for migraine. May repeat in 2 hours if headache persists or recurs.   Yes [provider]    Family History Family History  Problem Relation Age of Onset  . Heart disease Mother   . Diabetes Mother   . Heart disease Father   . Diabetes Father   . Breast cancer Neg Hx     Social History Social History   Tobacco Use  . Smoking status: Never Smoker  . Smokeless tobacco: Never Used  Substance Use Topics  . Alcohol use: No  . 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  Physical Exam Updated Vital Signs BP 110/73 (BP Location: Right Arm)   Pulse 72   Temp 98.2 F (36.8 C) (Oral)   Resp 18   Ht 5\' 3"  (1.6 m)   Wt 105.7 kg (233 lb)   LMP 10/23/2017   SpO2 97%   BMI 41.27 kg/m   Physical Exam  Constitutional: She is oriented to person, place, and time. She appears well-developed and well-nourished. No distress.  HENT:  Head: Normocephalic and atraumatic.  Eyes: EOM are normal. Pupils are equal, round, and reactive to light.  Neck: Normal range of motion.  Cardiovascular: Normal rate, regular rhythm and normal heart sounds.  Pulmonary/Chest: Effort normal and breath sounds normal.  Abdominal: Soft. She exhibits no distension. There is no tenderness.  Musculoskeletal: Normal range of motion.  Neurological: She is alert and oriented to person,  place, and time.  5/5 strength in major muscle groups of  bilateral upper and lower extremities. Speech normal. No facial asymetry.   Skin: Skin is warm and dry.  Psychiatric: She has a normal mood and affect. Judgment normal.  Nursing note and vitals reviewed.    ED Treatments / Results  Labs (all labs ordered are listed, but only abnormal results are displayed) Labs Reviewed - No data to display  EKG  EKG Interpretation None       Radiology No results found.  Procedures Procedures (including critical care time)  Medications Ordered in ED Medications  ketorolac (TORADOL) 30 MG/ML injection 30 mg (30 mg Intravenous Given 11/04/17 0910)  prochlorperazine (COMPAZINE) injection 10 mg (10 mg Intravenous Given 11/04/17 0910)  sodium chloride 0.9 % bolus 1,000 mL (1,000 mLs Intravenous New Bag/Given 11/04/17 0910)  morphine 4 MG/ML injection 4 mg (4 mg Intravenous Given 11/04/17 0910)     Initial Impression / Assessment and Plan / ED Course  I have reviewed the triage vital signs and the nursing notes.  Pertinent labs & imaging results that were available during my care of the patient were reviewed by me and considered in my medical decision making (see chart for details).     11:58 AM Patient feels much better at this time.  Resolution of her headache.  Reassuring recent MRI.  Outpatient neurology and primary care follow-up.  Patient understands return to the ER for new or worsening symptoms  Final Clinical Impressions(s) / ED Diagnoses   Final diagnoses:  Other migraine without status migrainosus, not intractable    ED Discharge Orders    None       Jola Schmidt, MD 11/04/17 1158

## 2017-11-04 NOTE — ED Triage Notes (Addendum)
Patient complains of ongoing headache and right eye pain for several weeks. Seen at [facility] neuro yesterday for same and started on steroids. States with the head pain she has difficulty speaking. Alert and oriented. No neuro deficits, expressive aphasia intermittently during triage

## 2017-11-09 ENCOUNTER — Telehealth: Payer: Self-pay | Admitting: Neurology

## 2017-11-09 NOTE — Telephone Encounter (Signed)
Pt called she is having pain in the head daily and is now having trouble focusing, concentrating at work, can't seem to do simple task. Some questions she has are: What is recovery time after LP, does she need to apply for short term disability? Is the pain causing these symptoms? Please call to advise.

## 2017-11-09 NOTE — Telephone Encounter (Signed)
I called the patient back and she stated that she has had a hard time concentrating at work. Her LP is scheduled for Thursday. She is aware that the recovery time is 24-48 hours. She hopes to feel better after the LP if there is pressure. She has asked about short term disability but will see how she does with the LP first.

## 2017-11-11 NOTE — Telephone Encounter (Signed)
Called patient. She stated that she has finished her 7 day steroid dose pack. She has also taken her Imitrex but was not aware that she can take it again in 2 hours and never taken 2 within 24 hours.Patient aware that if she needs to or if any new symptoms, she can go to ED. I advised to go ahead and take an Imitrex, repeat in 2 hours if it does not help her migraine, but do not exceed 2 tablets in 24 hours. Advised patient to try that along with rest and call us back if it does not help. She verbalized appreciation and understanding.

## 2017-11-11 NOTE — Telephone Encounter (Signed)
Patient states she is in extreme pain and wants to know if there is something she can take for the pain before the LP.

## 2017-11-12 ENCOUNTER — Ambulatory Visit
Admission: RE | Admit: 2017-11-12 | Discharge: 2017-11-12 | Disposition: A | Discharge status: Home / Self Care | Payer: 59 | Source: Ambulatory Visit | Attending: Neurology | Admitting: Neurology

## 2017-11-12 VITALS — BP 123/83 | HR 72

## 2017-11-12 DIAGNOSIS — G932 Benign intracranial hypertension: Secondary | ICD-10-CM

## 2017-11-12 DIAGNOSIS — R519 Headache, unspecified: Secondary | ICD-10-CM

## 2017-11-12 DIAGNOSIS — R51 Headache: Principal | ICD-10-CM

## 2017-11-12 HISTORY — PX: OTHER SURGICAL HISTORY: SHX169

## 2017-11-12 LAB — CSF CELL COUNT WITH DIFFERENTIAL
RBC Count, CSF: 46 cells/uL — ABNORMAL HIGH (ref 0–10)
WBC CSF: 0 {cells}/uL (ref 0–5)

## 2017-11-12 LAB — GLUCOSE, CSF: GLUCOSE CSF: 62 mg/dL (ref 40–80)

## 2017-11-12 LAB — PROTEIN, CSF: TOTAL PROTEIN, CSF: 46 mg/dL — AB (ref 15–45)

## 2017-11-12 NOTE — Discharge Instructions (Signed)

## 2017-11-13 ENCOUNTER — Telehealth: Payer: Self-pay | Admitting: Neurology

## 2017-11-13 DIAGNOSIS — G971 Other reaction to spinal and lumbar puncture: Secondary | ICD-10-CM

## 2017-11-13 NOTE — Telephone Encounter (Signed)
-----   Message from Melvenia Beam, MD sent at 11/12/2017  3:39 PM EST ----- Lumbar Puncture was normal. The pressure was good. This is not Intracranial Hypertension. thanks

## 2017-11-13 NOTE — Telephone Encounter (Signed)
Called the patient and made her aware of the lumbar puncture results. The patient states that she is still suffering with the headache. She states that she is taking the propanolol and that she also is taking the 2 of imitrex in a 24 hours time frame and that nothing has touched the headache and she is really needing relief. I informed that I would discuss with the MD and see what recommendations they would have to offer In hopes of being able to help her with her headache. Pt verbalized understanding

## 2017-11-13 NOTE — Telephone Encounter (Signed)
Please advise patient, that the steroid taper Dr. Jaynee Eagles prescribed, may still take some time to take full effect. She must have just finished it, as it was Rx on the 11/03/17.  Therefore, I would be reluctant to suggest anything new at this time. Dr. Jaynee Eagles wanted to consider Topamax, but I would say, we hold off until at least next week. Please advise patient that she may have to go to ER or UC, if pain is intolerable, but should avoid getting steroids in UC or ER.

## 2017-11-13 NOTE — Telephone Encounter (Signed)
Called the patient to make her aware of what Dr Rexene Alberts recommended. I expained that if the pain becomes intolerable she could go to UC or ED to have other medications offered but at this time she would like to wait and give the current medications time to work and let Dr Jaynee Eagles look into if there should be a certain change made. Pt verbalized understanding of everything recommended. She was appreciative for the call,

## 2017-11-15 NOTE — Telephone Encounter (Signed)
I would like to start Qudexy on this patient. Please call her and see if she is willing to start another medication for migraine prevention.

## 2017-11-16 ENCOUNTER — Ambulatory Visit
Admission: RE | Admit: 2017-11-16 | Discharge: 2017-11-16 | Disposition: A | Discharge status: Home / Self Care | Payer: 59 | Source: Ambulatory Visit | Attending: Neurology | Admitting: Neurology

## 2017-11-16 DIAGNOSIS — G971 Other reaction to spinal and lumbar puncture: Secondary | ICD-10-CM

## 2017-11-16 HISTORY — PX: EPIDURAL BLOOD PATCH: SHX1517

## 2017-11-16 MED ORDER — TOPIRAMATE ER 25 MG PO SPRINKLE CAP24: EXTENDED_RELEASE_CAPSULE | ORAL | 0 refills | Status: DC

## 2017-11-16 MED ORDER — IOPAMIDOL (ISOVUE-M 200) INJECTION 41%
1.0000 mL | Freq: Once | INTRAMUSCULAR | Status: AC
  Administered 2017-11-16: 1 mL via EPIDURAL

## 2017-11-16 NOTE — Telephone Encounter (Signed)
Per Dr. Jaynee Eagles since pt does not have IIH she would like to start a migraine medication, Qudexy XR 25 mg PO daily at bedtime x  1 week followed by 50 mg at bedtime x 1 week followed by 75 mg at bedtime x 1 week followed by 100 mg at bedtime which will be goal dose. Common side effects including but NOT limited to: numbness and tingling in the fingers and toes, sodas may taste flat/funny, weight loss, decreased appetite. Consult pharmacy at time of pickup for any other medication questions.   Ok to order blood patch per Dr. Jaynee Eagles.

## 2017-11-16 NOTE — Progress Notes (Signed)
20cc of blood drawn at 1210 from right [facility] space for epidural blood patch; site unremarkable.  Brita Romp, RN

## 2017-11-16 NOTE — Telephone Encounter (Signed)
2 boxes of Qudexy XR 25 mg capsules ready for pt pickup. Order placed in chart. Per Dr. Jaynee Eagles, have patient titrate up and see how she does before placing order for the 100 mg  Sent to her pharmacy. Called patient and informed her that blood patch had been ordered and that we have the samples ready for pickup. Patient will call us and let us know how she's doing after she reaches goal 100 mg dose. Patient also will stop med with any serious side effects and call and with any emergencies will call 911. She verbalized understanding and appreciation.

## 2017-11-16 NOTE — Addendum Note (Signed)
Addended by: Gildardo Griffes on: 11/16/2017 10:47 AM   Modules accepted: Orders

## 2017-11-16 NOTE — Addendum Note (Signed)
Addended by: Gildardo Griffes on: 11/16/2017 10:51 AM   Modules accepted: Orders

## 2017-11-16 NOTE — Discharge Instructions (Signed)

## 2017-11-16 NOTE — Telephone Encounter (Signed)
Patient called re: blood patch. I discussed that Dr. Jaynee Eagles has authorized order and it will be placed. I also informed her that since she does not have IIH Dr. Jaynee Eagles would like to start a daily preventative migraine medication, Qudexy XR. These are capsules. Dr. Jaynee Eagles would like for her to start taking 25 mg PO daily at bedtime x 1 week followed by 50 mg at bedtime x 1 week followed by 75 mg at bedtime x 1 week followed by 100 mg at bedtime which will be goal dose. Common side effects including but NOT limited to: numbness and tingling in the fingers and toes, sodas may taste flat/funny, weight loss, decreased appetite. Consult pharmacy at time of pickup for any other medication questions. The patient verbalized understanding. I discussed that Qudexy may be denied by her insurance but there is a copay card that she can pickup at the office. If we have a sample (per MD) of the 25 mg capsules she can get some to get her started. She verbalized appreciation and understanding.

## 2017-11-16 NOTE — Telephone Encounter (Signed)
Patient has LP headache since last Friday and says she cannot function. Does she need a blood patch? Please call and discuss.

## 2017-11-23 ENCOUNTER — Ambulatory Visit (INDEPENDENT_AMBULATORY_CARE_PROVIDER_SITE_OTHER): Payer: 59 | Admitting: Family Medicine

## 2017-11-23 ENCOUNTER — Encounter: Payer: Self-pay | Admitting: Family Medicine

## 2017-11-23 VITALS — BP 118/80 | HR 100 | Temp 98.7°F | Ht 63.0 in | Wt 228.4 lb

## 2017-11-23 DIAGNOSIS — R51 Headache: Secondary | ICD-10-CM

## 2017-11-23 DIAGNOSIS — R519 Headache, unspecified: Secondary | ICD-10-CM

## 2017-11-23 MED ORDER — PROPRANOLOL HCL 40 MG PO TABS: 40.0000 mg | ORAL_TABLET | Freq: Three times a day (TID) | ORAL | 2 refills | Status: DC

## 2017-11-23 MED ORDER — RIZATRIPTAN BENZOATE 10 MG PO TBDP: 10.0000 mg | ORAL_TABLET | ORAL | 0 refills | Status: DC | PRN

## 2017-11-23 MED ORDER — KETOROLAC TROMETHAMINE 60 MG/2ML IM SOLN
60.0000 mg | Freq: Once | INTRAMUSCULAR | Status: AC
  Administered 2017-11-23: 60 mg via INTRAMUSCULAR

## 2017-11-23 NOTE — Patient Instructions (Signed)
Keep appointment with neurology team. Try taking Maxalt rather than Imitrex. Various people respond differently to other types of meds in the same class.   Urgent care and our office are available if the headache becomes unbearable (Toradol injections rather than morphine) as an alternative to the ED.   Let us know if you need anything.

## 2017-11-23 NOTE — Progress Notes (Signed)
Pre visit review using our clinic review tool, if applicable. No additional management support is needed unless otherwise documented below in the visit note. 

## 2017-11-23 NOTE — Progress Notes (Signed)
Chief Complaint  Patient presents with  . Follow-up    Candice Oconnor is a 47 y.o. female here for evaluation of L sided headache.  Duration: 12 weeks Frustrating couple of mo, has had a constant HA, foggy thinking. She saw me around 6 weeks ago and had MRI and started on propranolol. F/u a couple days later in office prompted referral to ophtho and toradol which was helpful. Ophtho did not find anything wrong and referred her to neuro-ophtho which was not a fruitful visit. She was referred to neurology where she had a neg LP and was started on Topamax, medrol dosepak, and Imitrex. None have been particularly helpful thus far. She is tolerating propranolol well, which her neuro team wanted her to stay on. She has stopped chewing gum.   Past Medical History:  Diagnosis Date  . Chicken pox   . HPV in female   . Hyperlipidemia   . Migraine    Family History  Problem Relation Age of Onset  . Heart disease Mother   . Diabetes Mother   . Heart disease Father   . Diabetes Father   . Breast cancer Neg Hx    Current Meds  Medication Sig  . albuterol (PROVENTIL HFA;VENTOLIN HFA) 108 (90 BASE) MCG/ACT inhaler Inhale 1-2 puffs into the lungs every 6 (six) hours as needed for wheezing or shortness of breath.  Marland Kitchen azelastine (ASTELIN) 0.1 % nasal spray Place 2 sprays into both nostrils 2 (two) times daily. Use in each nostril as directed  . CAMILA 0.35 MG tablet Take 1 tablet by mouth daily.  . montelukast (SINGULAIR) 10 MG tablet Take 10 mg by mouth at bedtime.  . Topiramate ER (QUDEXY XR) 25 MG CS24 sprinkle cap Take 1 capsules (25 mg) by mouth daily at bedtime x 7 days, THEN  Take 2 capsules (50 mg) by mouth daily at bedtime x 7 days THEN Take 3 capsules (75 mg) by mouth daily at bedtime x 7 days, THEN  Take 4 capsules (100 mg) by mouth daily at bedtime thereafter  . [DISCONTINUED] methylPREDNISolone (MEDROL DOSEPAK) 4 MG TBPK tablet follow package directions  . [DISCONTINUED] propranolol  (INDERAL) 20 MG tablet Take 1 tablet (20 mg total) by mouth 3 (three) times daily.  . [DISCONTINUED] SUMAtriptan (IMITREX) 100 MG tablet Take 100 mg by mouth every 2 (two) hours as needed for migraine. May repeat in 2 hours if headache persists or recurs.    BP 118/80 (BP Location: Left Arm, Patient Position: Sitting, Cuff Size: Large)   Pulse 100   Temp 98.7 F (37.1 C) (Oral)   Ht 5\' 3"  (1.6 m)   Wt 228 lb 6 oz (103.6 kg)   SpO2 93%   BMI 40.45 kg/m  General: awake, alert, appearing stated age Eyes: PERRLA, EOMi Lungs: no accessory muscle use Neuro: CN 2-12 intact, no cerebellar signs, DTR's equal and symmetry, no clonus MSK: 5/5 strength throughout, normal gait, no TTP over posterior cervical triangle or paraspinal cervical musculature Psych: Age appropriate judgment and insight, mood and affect normal, she was tearful throughout exam 2/2 frustration  Chronic daily headache - Plan: propranolol (INDERAL) 40 MG tablet, rizatriptan (MAXALT-MLT) 10 MG disintegrating tablet, ketorolac (TORADOL) injection 60 mg  Orders as above. Increase dose of propranolol. Change Imitrex to Maxalt. Toradol today. 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 In that case, urgent care or a [facility]  primary care office is appropriate.  Follow up in 6 weeks as originally scheduled. The patient voiced understanding and agreement to the plan.  Crosby Oyster [place], [place] 8:47 AM 11/23/17

## 2017-11-25 NOTE — Telephone Encounter (Signed)
Spoke with patient. She saw her PCP Monday for a "monthly check" and he changed her Propranolol from 20 mg to 40 mg TID. He also changed her Imitrex to Rizatriptan. Monday and Tuesday she felt better. However Tuesday night she took her first dose of Qudexy 50 mg and had abdominal cramping and diarrhea all night. She reported she was lightheaded and dizzy this morning while in the shower. She vomited, had diarrhea all night. She denied any known recent exposure to stomach virus. Discussed with patient to decrease Qudexy back to 25 mg nightly for one week then can try 50 mg again or call office to discuss before increasing. She stated she has been out of work since February 6th, the day before her LP. She said she thinks her work has her on disability and requires paperwork. I advised that her work has to generate the forms and the MD would have to agree to complete them. Also, there is a $50 charge involved. Patient verbalized understanding.  Dr. Jaynee Eagles aware of the above.

## 2017-11-25 NOTE — Telephone Encounter (Signed)
Patient titrated up to 50 mg Topiramate ER (QUDEXY XR) 25 MG CS24 sprinkle cap last night. It made her so sick and she is still sick today. Please call and discuss.

## 2017-11-27 NOTE — Telephone Encounter (Signed)
Pt is asking for RN Romelle Starcher to call her on Monday re: paperwork for [employer], pt also wants  Dr Jaynee Eagles made aware that since her LP she has not been at work (11-12-2017).  Please call

## 2017-11-30 NOTE — Telephone Encounter (Addendum)
Spoke with patient. She stated that she is tolerating Qudexy XR 25 mg. Between the Qudexy, Propranolol and Maxalt, her headaches are controlled. However, Maxalt wears off and then she feels headache coming back. Qudexy at 25 mg does numbness/tingling in fingers toes, slight stomach cramp but "nothing like the 50".  Pt will touch base with Dr. Jaynee Eagles again in a few days after being on the 25 mg for a week to discuss next steps. She stated that Christella Scheuermann has paperwork initiated and is sending them to our office. She would like to go back to work later this week or possibly Monday since her headaches are more controlled. Patient is going to call Christella Scheuermann to make sure the paperwork gets here and will also pay.

## 2017-12-01 NOTE — Telephone Encounter (Signed)
Let's try 50mg  again. Can you ensure she has a follow up? Can see megan millikan.

## 2017-12-01 NOTE — Telephone Encounter (Signed)
Pt has f/u scheduled on 12/09/17 with Dr. Jaynee Eagles. MD aware.

## 2017-12-02 ENCOUNTER — Telehealth: Payer: Self-pay | Admitting: *Deleted

## 2017-12-02 DIAGNOSIS — Z0289 Encounter for other administrative examinations: Secondary | ICD-10-CM

## 2017-12-02 NOTE — Telephone Encounter (Signed)
Spoke with patient. Discussed to try Qudexy  50 mg again. Also discussed that patient has a follow up appt next week on March 6th. If doing well on 50 mg, can discuss at next appt. If not, patient will call before appt. Patient also stated she plans to go back to work Monday. Pt reported paperwork has been brought to office and paid for. She verbalized understanding and appreciation for the call.

## 2017-12-02 NOTE — Telephone Encounter (Addendum)
Pt [employer] form on [employer].

## 2017-12-04 ENCOUNTER — Telehealth: Payer: Self-pay | Admitting: Neurology

## 2017-12-04 NOTE — Telephone Encounter (Signed)
Spoke with patient. She is not tolerating Qudexy 50 mg. Advised patient to continue taking 25 mg instead. Patient has f/u next week. Informed pt that RN would let MD know and will call pt back if any changes need to be made.   D/w Dr. Krista Blue (wid), pt can continue 25 mg dose until seen in office next week to reevaluate.

## 2017-12-04 NOTE — Telephone Encounter (Signed)
[employer] F[employer] form completed, signed, and sent to medical records.

## 2017-12-04 NOTE — Telephone Encounter (Signed)
Pt said she decreased Topiramate ER (QUDEXY XR) 25 MG CS24 sprinkle cap from 50mg  to 25mg  as instructed by Dr Jaynee Eagles due to causing her N&V for a week. She increased back to 50mg  Wednesday night, she got sick and had n&v. She went back to the 25mg  last night with no problems. She said the HA's seem to be ok while taking 25mg . Please call to advise what she needs to do.

## 2017-12-07 ENCOUNTER — Telehealth: Payer: Self-pay

## 2017-12-07 NOTE — Telephone Encounter (Signed)
Spoke with patient. Per Dr. Nani Ravens patient was to come in for an office visit to receive injection rather than to go to ED. I offered appointment to patient but she declined. She states she is going to take her rizatriptan and lay down. She states it seems to be getting better each day. Advised patient to call back if she continues to fele bad.

## 2017-12-08 ENCOUNTER — Telehealth: Payer: Self-pay | Admitting: *Deleted

## 2017-12-08 NOTE — Telephone Encounter (Signed)
Pt [employer] form faxed to [phone]

## 2017-12-09 ENCOUNTER — Ambulatory Visit (INDEPENDENT_AMBULATORY_CARE_PROVIDER_SITE_OTHER): Payer: 59 | Admitting: Neurology

## 2017-12-09 ENCOUNTER — Encounter: Payer: Self-pay | Admitting: Neurology

## 2017-12-09 VITALS — BP 116/68 | HR 76 | Ht 63.0 in | Wt 230.0 lb

## 2017-12-09 DIAGNOSIS — G43709 Chronic migraine without aura, not intractable, without status migrainosus: Secondary | ICD-10-CM | POA: Diagnosis not present

## 2017-12-09 MED ORDER — ERENUMAB-AOOE 70 MG/ML ~~LOC~~ SOAJ: 140.0000 mg | SUBCUTANEOUS | 11 refills | Status: DC

## 2017-12-09 NOTE — Progress Notes (Signed)
[facility]    Provider:  Dr Jaynee Eagles Referring Provider: Shelda Oconnor* Primary Care Physician:  Candice Pal, DO  CC:  Migraines  Interval history 12/09/2017: Extensive workup and LP normal. MRi brain normal (reviewed images with patient and answered all questions), opening pressure was 16 on LP. She was started on propranolol and Topiramate ER (qudexy). She is doing well. She is on 40,g propranolol and Quedexy 25mg . She is having side effects to Qudexy. Stop the Quedexy due to side effects.  Since last Thursday feels better on the propranolol. Discussed stopping Topiramate, will add on Ajovy.  HPI:  Candice Oconnor is a 47 y.o. female here as a referral from Dr. Nani Ravens for migraines. PMHx migraine.  Headaches started in her early 110s, were triggered by stress and poor eating. She would have them occasionally. For the past 3 months, headaches have been more frequent and now she has daily headaches. Imitrex doesn;t help. On the 21st, eye pain woke her up in the middle of the night. She had an MRI brain, she saw her eye doctor, she saw ophthalmology and also her primary care. MRI brain was negative. Her migraines are in the frontal area and on the top of her head on left, light sensitivity, nausea, pulsating like a heart beat and sharp pains, constant. A wet cloth and laying in the dark help, they can last 24 hours. She has other headaches, more pressure on the right side of the head, constant dull, she has mild continuous light sensitivity and some mild nausea. Can wax and wane but it has been continuous since the 21st and right eye pain. She has tried imitrex, ibuprofen, she was started on propranolol and is feeling better. Sister has migraines. No medication overuse. She is aware of rebound headaches. Vision is worsening but she denies diplopia, vision is blurry, she feels like her ears are full, lots of pressure, No other focal neurologic deficits, associated  symptoms, inciting events or modifiable factors.  Reviewed notes, labs and imaging from outside physicians, which showed:  Personally reviewed MRI images, normal  Cbc/cmp normal  Review of Systems: Patient complains of symptoms per HPI as well as the following symptoms: confusion, headache, insomnia, restless legs. . Pertinent negatives and positives per HPI. All others negative.   Social History   Socioeconomic History  . Marital status: Divorced    Spouse name: Not on file  . Number of children: 1  . Years of education: Not on file  . Highest education level: Associate degree: occupational, Hotel manager, or vocational program  Social Needs  . Financial resource strain: Not on file  . Food insecurity - worry: Not on file  . Food insecurity - inability: Not on file  . Transportation needs - medical: Not on file  . Transportation needs - non-medical: Not on file  Occupational History  . Not on file  Tobacco Use  . Smoking status: Never Smoker  . Smokeless tobacco: Never Used  Substance and Sexual Activity  . Alcohol use: No  . 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 Sexual activity: Not Currently    Partners: Male  Other Topics Concern  . Not on file  Social History Narrative   Lives at home with her child   Right handed   Drinks 1 cup of caffeine daily    Family History  Problem Relation Age of Onset  . Heart disease Mother   . Diabetes Mother   .  Heart disease Father   . Diabetes Father   . Breast cancer Neg Hx     Past Medical History:  Diagnosis Date  . Chicken pox   . HPV in female   . Hyperlipidemia   . Migraine     Past Surgical History:  Procedure Laterality Date  . EPIDURAL BLOOD [facility]  11/16/2017  . LUMBAR PUNCTURE  11/12/2017    Current Outpatient Medications  Medication Sig Dispense Refill  . albuterol (PROVENTIL HFA;VENTOLIN HFA) 108 (90 BASE) MCG/ACT inhaler Inhale 1-2 puffs into the lungs every 6 (six) hours  as needed for wheezing or shortness of breath.    Marland Kitchen azelastine (ASTELIN) 0.1 % nasal spray Place 2 sprays into both nostrils 2 (two) times daily. Use in each nostril as directed 30 mL 12  . CAMILA 0.35 MG tablet Take 1 tablet by mouth daily.  4  . montelukast (SINGULAIR) 10 MG tablet Take 10 mg by mouth at bedtime.    . propranolol (INDERAL) 40 MG tablet Take 1 tablet (40 mg total) by mouth 3 (three) times daily. 90 tablet 2  . rizatriptan (MAXALT-MLT) 10 MG disintegrating tablet Take 1 tablet (10 mg total) by mouth as needed for migraine. May repeat in 2 hours if needed 10 tablet 0  . Topiramate ER (QUDEXY XR) 25 MG CS24 sprinkle cap Take 1 capsules (25 mg) by mouth daily at bedtime x 7 days, THEN  Take 2 capsules (50 mg) by mouth daily at bedtime x 7 days THEN Take 3 capsules (75 mg) by mouth daily at bedtime x 7 days, THEN  Take 4 capsules (100 mg) by mouth daily at bedtime thereafter 60 each 0   No current facility-administered medications for this visit.     Allergies as of 12/09/2017 - Review Complete 12/09/2017  Allergen Reaction Noted  . Other Swelling 06/21/2014    Vitals: BP 116/68 (BP Location: Left Arm, Patient Position: Sitting)   Pulse 76   Ht 5\' 3"  (1.6 m)   Wt 230 lb (104.3 kg)   BMI 40.74 kg/m  Last Weight:  Wt Readings from Last 1 Encounters:  12/09/17 230 lb (104.3 kg)   Last Height:   Ht Readings from Last 1 Encounters:  12/09/17 5\' 3"  (1.6 m)    Physical exam: Exam: Gen: NAD, conversant, well nourised, obese, well groomed                     CV: RRR, no MRG. No Carotid Bruits. No peripheral edema, warm, nontender Eyes: Conjunctivae clear without exudates or hemorrhage  Neuro: Detailed Neurologic Exam  Speech:    Speech is normal; fluent and spontaneous with normal comprehension.  Cognition:    The patient is oriented to person, place, and time;     recent and remote memory intact;     language fluent;     normal attention, concentration,      fund of knowledge Cranial Nerves:    The pupils are equal, round, and reactive to light. The fundi are normal and spontaneous venous pulsations are present. Visual fields are full to finger confrontation. Extraocular movements are intact. Trigeminal sensation is intact and the muscles of mastication are normal. The face is symmetric. The palate elevates in the midline. Hearing intact. Voice is normal. Shoulder shrug is normal. The tongue has normal motion without fasciculations.   Coordination:    Normal finger to nose and heel to shin. Normal rapid alternating movements.   Gait:  Heel-toe and tandem gait are normal.   Motor Observation:    No asymmetry, no atrophy, and no involuntary movements noted. Tone:    Normal muscle tone.    Posture:    Posture is normal. normal erect    Strength:    Strength is V/V in the upper and lower limbs.      Sensation: intact to LT     Reflex Exam:  DTR's:    Deep tendon reflexes in the upper and lower extremities are normal bilaterally.   Toes:    The toes are downgoing bilaterally.   Clonus:    Clonus is absent.      Assessment/Plan:  47 year old with progressively worsening headache, daily, more pressure quality. She has a PMHx of migraines, obesity. May be status migrainosus and will try and treat with a taper of steroids but suspicious of IIH will order lumbar puncture.   Extensive workup and LP normal. MRi brain normal (reviewed images with patient and answered all questions), opening pressure was 16 on LP. She was started on propranolol and Topiramate ER (qudexy).   Also need to consider Sleep Apnea which can cause worsening intractable headaches however ESS was <10 Healthy Weight and [facility] her at [facility] for [facility], highly recommend  - Doing well on propranolol. Stop Quedext due to side effects. Start Ajovy.    Discussed: To prevent or relieve headaches, try the following: Cool Compress. Lie down and place a cool  compress on your head.  Avoid headache triggers. If certain foods or odors seem to have triggered your migraines in the past, avoid them. A headache diary might help you identify triggers.  Include physical activity in your daily routine. Try a daily walk or other moderate aerobic exercise.  Manage stress. Find healthy ways to cope with the stressors, such as delegating tasks on your to-do list.  Practice relaxation techniques. Try deep breathing, yoga, massage and visualization.  Eat regularly. Eating regularly scheduled meals and maintaining a healthy diet might help prevent headaches. Also, drink plenty of fluids.  Follow a regular sleep schedule. Sleep deprivation might contribute to headaches Consider biofeedback. With this mind-body technique, you learn to control certain bodily functions - such as muscle tension, heart rate and blood pressure - to prevent headaches or reduce headache pain.    Proceed to emergency room if you experience new or worsening symptoms or symptoms do not resolve, if you have new neurologic symptoms or if headache is severe, or for any concerning symptom.   Provided education and documentation from American headache Society toolbox including articles on: chronic migraine medication overuse headache, chronic migraines, prevention of migraines, behavioral and other nonpharmacologic treatments for headache.  A total of 25 minutes was spent in with this patient face-to-face. Over half this time was spent on counseling patient on the migraine diagnosis and different therapeutic options available.    Sarina Ill, MD  [facility] [address] [place], [place] [address]  Phone [phone] Fax [phone]

## 2017-12-10 ENCOUNTER — Telehealth: Payer: Self-pay | Admitting: *Deleted

## 2017-12-10 ENCOUNTER — Other Ambulatory Visit: Payer: Self-pay | Admitting: Family Medicine

## 2017-12-10 DIAGNOSIS — R51 Headache: Principal | ICD-10-CM

## 2017-12-10 DIAGNOSIS — R519 Headache, unspecified: Secondary | ICD-10-CM

## 2017-12-10 NOTE — Telephone Encounter (Signed)
Completed Aimovig PA on Cover My Meds KEY: [id number]. Determination expected within 72 hours.

## 2017-12-10 NOTE — Telephone Encounter (Addendum)
Aimovig PA denied due to the following reasons:  1. Medication overuse headache has been considered and potentially offending medications have been discontinued AND 2. Pt has greater than or equal to 15 headache days per month of which at least 8 must be migraine days for at least 3 months.   Called pt's plan, [employer]. Discussed that pt's Aimovig was denied but office note addresses those issues.   Will fax office note with denial letter addressing issues to [phone].

## 2017-12-10 NOTE — Telephone Encounter (Addendum)
Spoke with patient. She had daily headaches, at least 15 days per month were migraines with associated nausea and vision changes.    Faxed supporting information/office notes to [employer]. Received a receipt of confirmation.

## 2017-12-14 ENCOUNTER — Other Ambulatory Visit: Payer: Self-pay | Admitting: Family Medicine

## 2017-12-14 NOTE — Telephone Encounter (Signed)
Received faxed notification from [employer] that Aimovig 140 mg dose has been APPROVED through 06/12/18. Called the patient and informed her of the approval. She verbalized appreciation and will call the pharmacy to get the dose filled.

## 2017-12-21 ENCOUNTER — Ambulatory Visit: Payer: 59 | Admitting: Family Medicine

## 2018-01-04 ENCOUNTER — Encounter: Payer: 59 | Admitting: Family Medicine

## 2018-01-27 ENCOUNTER — Encounter: Payer: 59 | Admitting: Family Medicine

## 2018-02-10 ENCOUNTER — Other Ambulatory Visit: Payer: Self-pay | Admitting: Family Medicine

## 2018-02-10 DIAGNOSIS — R51 Headache: Principal | ICD-10-CM

## 2018-02-10 DIAGNOSIS — R519 Headache, unspecified: Secondary | ICD-10-CM

## 2018-02-11 ENCOUNTER — Other Ambulatory Visit: Payer: Self-pay | Admitting: Neurology

## 2018-02-11 DIAGNOSIS — R519 Headache, unspecified: Secondary | ICD-10-CM

## 2018-02-11 DIAGNOSIS — R51 Headache: Principal | ICD-10-CM

## 2018-02-16 ENCOUNTER — Other Ambulatory Visit: Payer: Self-pay | Admitting: Neurology

## 2018-02-16 ENCOUNTER — Other Ambulatory Visit: Payer: Self-pay | Admitting: *Deleted

## 2018-02-16 ENCOUNTER — Telehealth: Payer: Self-pay | Admitting: Neurology

## 2018-02-16 DIAGNOSIS — R51 Headache: Principal | ICD-10-CM

## 2018-02-16 DIAGNOSIS — R519 Headache, unspecified: Secondary | ICD-10-CM

## 2018-02-16 MED ORDER — RIZATRIPTAN BENZOATE 10 MG PO TBDP: ORAL_TABLET | ORAL | 11 refills | Status: DC

## 2018-02-16 NOTE — Telephone Encounter (Signed)
Spoke with patient. She would like for Dr. Jaynee Eagles to prescribe Rizatriptan. She has taken this before and it was prescribed by another doctor. She really only needs this when the Aimovig is wearing off. She has also stated that Aimovig is "awesome" and she is doing "so much better". RN informed pt that she would send the request to Dr. Jaynee Eagles.

## 2018-02-16 NOTE — Telephone Encounter (Signed)
Pt calling requesting a call back to discuss Dr. Jaynee Eagles picking up refills for rizatriptan (MAXALT-MLT) 10 MG disintegrating tablet if willing please send to [facility]. Please call to advise

## 2018-03-15 ENCOUNTER — Other Ambulatory Visit: Payer: Self-pay | Admitting: Family Medicine

## 2018-03-15 DIAGNOSIS — R519 Headache, unspecified: Secondary | ICD-10-CM

## 2018-03-15 DIAGNOSIS — R51 Headache: Principal | ICD-10-CM

## 2018-06-13 ENCOUNTER — Encounter (HOSPITAL_COMMUNITY): Payer: Self-pay | Admitting: Emergency Medicine

## 2018-06-13 ENCOUNTER — Emergency Department (HOSPITAL_COMMUNITY): Payer: 59

## 2018-06-13 ENCOUNTER — Other Ambulatory Visit: Payer: Self-pay

## 2018-06-13 ENCOUNTER — Emergency Department (HOSPITAL_COMMUNITY)
Admission: EM | Admit: 2018-06-13 | Discharge: 2018-06-13 | Disposition: A | Discharge status: Home / Self Care | Payer: 59 | Attending: Emergency Medicine | Admitting: Emergency Medicine

## 2018-06-13 DIAGNOSIS — Z79899 Other long term (current) drug therapy: Secondary | ICD-10-CM | POA: Diagnosis not present

## 2018-06-13 DIAGNOSIS — R109 Unspecified abdominal pain: Secondary | ICD-10-CM | POA: Diagnosis not present

## 2018-06-13 LAB — URINALYSIS, ROUTINE W REFLEX MICROSCOPIC
Bilirubin Urine: NEGATIVE
Glucose, UA: NEGATIVE mg/dL
Hgb urine dipstick: NEGATIVE
KETONES UR: NEGATIVE mg/dL
Nitrite: NEGATIVE
PROTEIN: NEGATIVE mg/dL
Specific Gravity, Urine: 1.02 (ref 1.005–1.030)
pH: 5 (ref 5.0–8.0)

## 2018-06-13 LAB — HEPATIC FUNCTION PANEL
ALBUMIN: 3.6 g/dL (ref 3.5–5.0)
ALK PHOS: 60 U/L (ref 38–126)
ALT: 12 U/L (ref 0–44)
AST: 13 U/L — AB (ref 15–41)
Bilirubin, Direct: <0.1 mg/dL (ref 0.0–0.2)
TOTAL PROTEIN: 7.2 g/dL (ref 6.5–8.1)
Total Bilirubin: 0.3 mg/dL (ref 0.3–1.2)

## 2018-06-13 LAB — BASIC METABOLIC PANEL WITH GFR
Anion gap: 8 (ref 5–15)
BUN: 11 mg/dL (ref 6–20)
CO2: 24 mmol/L (ref 22–32)
Calcium: 9.5 mg/dL (ref 8.9–10.3)
Chloride: 107 mmol/L (ref 98–111)
Creatinine, Ser: 0.8 mg/dL (ref 0.44–1.00)
GFR calc Af Amer: >60 mL/min
GFR calc non Af Amer: >60 mL/min
Glucose, Bld: 96 mg/dL (ref 70–99)
Potassium: 4.5 mmol/L (ref 3.5–5.1)
Sodium: 139 mmol/L (ref 135–145)

## 2018-06-13 LAB — I-STAT BETA HCG BLOOD, ED (MC, WL, AP ONLY)

## 2018-06-13 LAB — CBC
HCT: 40.5 % (ref 36.0–46.0)
Hemoglobin: 13.2 g/dL (ref 12.0–15.0)
MCH: 28.5 pg (ref 26.0–34.0)
MCHC: 32.6 g/dL (ref 30.0–36.0)
MCV: 87.5 fL (ref 78.0–100.0)
Platelets: 291 10*3/uL (ref 150–400)
RBC: 4.63 MIL/uL (ref 3.87–5.11)
RDW: 13.3 % (ref 11.5–15.5)
WBC: 10.6 10*3/uL — ABNORMAL HIGH (ref 4.0–10.5)

## 2018-06-13 LAB — LIPASE, BLOOD: Lipase: 38 U/L (ref 11–51)

## 2018-06-13 MED ORDER — ONDANSETRON 4 MG PO TBDP: 4.0000 mg | ORAL_TABLET | Freq: Three times a day (TID) | ORAL | 0 refills | Status: DC | PRN

## 2018-06-13 MED ORDER — CEPHALEXIN 500 MG PO CAPS: 500.0000 mg | ORAL_CAPSULE | Freq: Three times a day (TID) | ORAL | 0 refills | Status: DC

## 2018-06-13 NOTE — ED Notes (Signed)
Patient transported to CT 

## 2018-06-13 NOTE — ED Notes (Signed)
Pt verbalized understanding of dc instructions, vss, ambulatory upon discharge.  

## 2018-06-13 NOTE — ED Provider Notes (Signed)
[facility] EMERGENCY DEPARTMENT Provider Note   CSN: [id number] Arrival date & time: 06/13/18  6073     History   Chief Complaint Chief Complaint  Patient presents with  . Flank Pain  . Nausea    HPI Candice Oconnor is a 47 y.o. female who presents with flank pain. PMH significant for recurrent UTI, migraines. She states that about a month ago she went to UC and was treated for a kidney infection because she was having flank pain and nausea. She states that she has a hx of recurrent UTIs and often doesn't have classic UTI symptoms until she has other symptoms (back pain, nausea) and is tested. She was treated with a 10 day course of antibiotics. She cannot recall what they were. She did get better however about 1 week ago started to have returning symptoms. The pain is bilateral but worse on the L side. She has a hx of chronic low back pain which she manages with stretching and states this is not similar. She also has a hx of kidney stones and states it doesn't feel like that either (not as severe) but it's in the same place. She reports "low grade" fever, nausea, and it hurts in the suprapubic region when she doesn't urinate. She denies chills, severe pain, abdominal pain, vomiting, diarrhea/consitpation, vaginal discharge or bleeding. She also reports episodic "irregular heart rate" but not currently. She has had this before when she was pregnant.  HPI  Past Medical History:  Diagnosis Date  . Chicken pox   . HPV in female   . Hyperlipidemia   . Migraine     Patient Active Problem List   Diagnosis Date Noted  . Blurry vision, bilateral 10/26/2017  . Frequent headaches 10/26/2017  . Mixed hyperlipidemia 08/31/2017  . Morbid obesity ([place]) 03/03/2012  . Heavy periods 03/03/2012  . Back pain 03/03/2012  . Urinary tract infection 03/03/2012    Past Surgical History:  Procedure Laterality Date  . EPIDURAL BLOOD [facility]  11/16/2017  . LUMBAR PUNCTURE  11/12/2017      OB History    Gravida  2   Para  1   Term      Preterm      AB      Living        SAB      TAB      Ectopic      Multiple      Live Births               Home Medications    Prior to Admission medications   Medication Sig Start Date End Date Taking? Authorizing Provider  albuterol (PROVENTIL HFA;VENTOLIN HFA) 108 (90 BASE) MCG/ACT inhaler Inhale 1-2 puffs into the lungs every 6 (six) hours as needed for wheezing or shortness of breath.    [provider]  azelastine (ASTELIN) 0.1 % nasal spray Place 2 sprays into both nostrils 2 (two) times daily. Use in each nostril as directed 09/07/17   Shelda Pal, DO  CAMILA 0.35 MG tablet Take 1 tablet by mouth daily. 11/03/17   [provider]  Erenumab-aooe (AIMOVIG 140 DOSE) 70 MG/ML SOAJ Inject 140 mg into the skin every 30 (thirty) days. 12/09/17   Melvenia Beam, MD  montelukast (SINGULAIR) 10 MG tablet Take 10 mg by mouth at bedtime.    [provider]  propranolol (INDERAL) 40 MG tablet TAKE 1 TABLET BY MOUTH 3 (THREE) TIMES DAILY. 03/15/18  Shelda Pal, DO  rizatriptan (MAXALT-MLT) 10 MG disintegrating tablet TAKE 1 TABLET AS NEEDED FOR MIGRAINE. MAY REPEAT IN 2HRS IF NEEDED 02/16/18   Melvenia Beam, MD    Family History Family History  Problem Relation Age of Onset  . Heart disease Mother   . Diabetes Mother   . Heart disease Father   . Diabetes Father   . Breast cancer Neg Hx     Social History Social History   Tobacco Use  . Smoking status: Never Smoker  . Smokeless tobacco: Never Used  Substance Use Topics  . Alcohol use: No  . 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 Negative for chills.  Respiratory: Negative for shortness of breath.   Cardiovascular: Negative for chest pain.  Gastrointestinal: Positive for nausea.  Negative for abdominal pain, constipation, diarrhea and vomiting.  Genitourinary: Positive for flank pain. Negative for decreased urine volume, difficulty urinating, dysuria, frequency, hematuria, pelvic pain, vaginal bleeding and vaginal discharge.  All other systems reviewed and are negative.    Physical Exam Updated Vital Signs BP 119/75   Pulse 83   Temp 97.7 F (36.5 C) (Oral)   Resp 12   LMP 05/30/2018 (Approximate)   SpO2 97%   Physical Exam  Constitutional: She is oriented to person, place, and time. She appears well-developed and well-nourished. No distress.  Calm, cooperative. NAD. Pleasant  HENT:  Head: Normocephalic and atraumatic.  Eyes: Pupils are equal, round, and reactive to light. Conjunctivae are normal. Right eye exhibits no discharge. Left eye exhibits no discharge. No scleral icterus.  Neck: Normal range of motion.  Cardiovascular: Normal rate and regular rhythm.  Pulmonary/Chest: Effort normal and breath sounds normal. No respiratory distress.  Abdominal: Soft. Bowel sounds are normal. She exhibits no distension and no mass. There is tenderness (LUQ, L CVA tenderness). There is no rebound and no guarding. No hernia.  Neurological: She is alert and oriented to person, place, and time.  Skin: Skin is warm and dry.  Psychiatric: She has a normal mood and affect. Her behavior is normal.  Nursing note and vitals reviewed.    ED Treatments / Results  Labs (all labs ordered are listed, but only abnormal results are displayed) Labs Reviewed  URINALYSIS, ROUTINE W REFLEX MICROSCOPIC - Abnormal; Notable for the following components:      Result Value   APPearance HAZY (*)    Leukocytes, UA MODERATE (*)    Bacteria, UA RARE (*)    All other components within normal limits  CBC - Abnormal; Notable for the following components:   WBC 10.6 (*)    All other components within normal limits  HEPATIC FUNCTION PANEL - Abnormal; Notable for the following components:    AST 13 (*)    All other components within normal limits  URINE CULTURE  BASIC METABOLIC PANEL  LIPASE, BLOOD  I-STAT BETA HCG BLOOD, ED ([facility], [facility], [facility] ONLY)    EKG None  Radiology Ct Renal Stone Study  Result Date: 06/13/2018 CLINICAL DATA:  Patient treated for kidney infection 1 month ago. Worsening bilateral flank pain and nausea. EXAM: CT ABDOMEN AND PELVIS WITHOUT CONTRAST TECHNIQUE: Multidetector CT imaging of the abdomen and pelvis was performed following the standard protocol without IV contrast. COMPARISON:  July 22, 2015 FINDINGS: Lower chest: No acute abnormality. Hepatobiliary: No focal liver abnormality  is seen. No gallstones, gallbladder wall thickening, or biliary dilatation. Pancreas: Unremarkable. No pancreatic ductal dilatation or surrounding inflammatory changes. Spleen: Normal in size without focal abnormality. Adrenals/Urinary Tract: Adrenal glands are normal. There is a 5 mm stone in the lower pole the right kidney. No left-sided renal stones. No hydronephrosis or perinephric stranding. The ureters and bladder are normal. Stomach/Bowel: The stomach and small bowel are normal. The colon and appendix are normal. Vascular/Lymphatic: No significant vascular findings are present. No enlarged abdominal or pelvic lymph nodes. Reproductive: Uterus and bilateral adnexa are unremarkable. Other: No abdominal wall hernia or abnormality. No abdominopelvic ascites. Musculoskeletal: No acute or significant osseous findings. IMPRESSION: 1. Nonobstructive stone in the right kidney. No other abnormalities identified to explain the patient's symptoms. Electronically Signed   By: Dorise Bullion III M.D   On: 06/13/2018 09:42    Procedures Procedures (including critical care time)  Medications Ordered in ED Medications - No data to display   Initial Impression / Assessment and Plan / ED Course  I have reviewed the triage vital signs and the nursing notes.  Pertinent labs & imaging results  that were available during my care of the patient were reviewed by me and considered in my medical decision making (see chart for details).  47 year old non-pregnant female presents with flank pain and nausea for the past week. Her vitals ar normal. She is comfortable appearing. She does have L CVA tenderness and LUQ tenderness on exam which seems mild. CBC is remarkable for mild leukocytosis. CMP and lipase are normal. UA has moderate leukocytes, 11-20 WBC but appears contaminated. Culture was sent. CT renal was obtained since pt had recurrence of symptoms so soon after initial treatment. It was normal. Will treat for clinical pyelonephritis and have her f/u with her PCP. Return precautions given.  Final Clinical Impressions(s) / ED Diagnoses   Final diagnoses:  Flank pain    ED Discharge Orders    None       Recardo Evangelist, PA-C 06/13/18 1016    Isla Pence, MD 06/13/18 1122

## 2018-06-13 NOTE — Discharge Instructions (Signed)
Take Keflex three times daily for 10 days Take Ibuprofen for pain Take Zofran for nausea Please follow up with your doctor and return if worsening

## 2018-06-13 NOTE — ED Triage Notes (Signed)
Pt reports she was treated for a kidney infection 1 month ago, felt like symptoms somewhat subsided but starting 1 week ago she began having worsening bilateral flank pain and nausea. Denies painful urination or vomiting. Reports "low grade fevers" at home.

## 2018-06-14 LAB — URINE CULTURE: Culture: <10000 — AB

## 2018-06-16 ENCOUNTER — Ambulatory Visit: Payer: 59 | Admitting: Adult Health

## 2018-06-29 ENCOUNTER — Telehealth: Payer: Self-pay | Admitting: Neurology

## 2018-06-29 NOTE — Telephone Encounter (Signed)
Pending approval for Aimovig 140 mg/mL Key: [id number] Rx number: [id number] [employer]

## 2018-06-29 NOTE — Telephone Encounter (Signed)
[facility] called stating the rx for Aimovig is needed a new PA as of 06/12/18. Stating to complete and mark as urgent

## 2018-07-05 NOTE — Telephone Encounter (Signed)
Received a denial for Aimovig. Continuation requires that the patient have a positive response to therapy demonstrated by a reduced headache frequency or intensity and the use of acute migraine medications has decreased since the start of the therapy.   I called the patient and asked how she was doing on the [place]. She stated she is "doing wonderful". She only requires the use of an acute migraine med at the very end of the month when the Aimovig has worn off. Said she might get a migraine for like a day or two at the end of the 30 days. Otherwise she is doing very well and seems very pleased with it.

## 2018-07-08 ENCOUNTER — Other Ambulatory Visit: Payer: Self-pay | Admitting: Family Medicine

## 2018-07-08 DIAGNOSIS — R519 Headache, unspecified: Secondary | ICD-10-CM

## 2018-07-08 DIAGNOSIS — R51 Headache: Principal | ICD-10-CM

## 2018-07-08 NOTE — Telephone Encounter (Signed)
Spoke with Kamiah regarding Aimovig denial and gave her the updated information for pt's Aimovig including that patient has experienced a positive response to therapy and decreased use of acute migraine medications. RN received approval through 07/09/2019. Case number: [id number].   Called patient and informed her of the updated approval through 07/09/2019. She verbalized appreciation.

## 2018-10-27 ENCOUNTER — Encounter: Payer: Self-pay | Admitting: Neurology

## 2018-10-27 ENCOUNTER — Ambulatory Visit (INDEPENDENT_AMBULATORY_CARE_PROVIDER_SITE_OTHER): Payer: 59 | Admitting: Neurology

## 2018-10-27 ENCOUNTER — Other Ambulatory Visit: Payer: Self-pay

## 2018-10-27 VITALS — BP 118/73 | HR 83 | Resp 16 | Ht 63.0 in | Wt 244.0 lb

## 2018-10-27 DIAGNOSIS — G43709 Chronic migraine without aura, not intractable, without status migrainosus: Secondary | ICD-10-CM

## 2018-10-27 NOTE — Patient Instructions (Addendum)
Continue Amovig as prescribed Stop rizatriptan for abortive therapy Start Ubrelvy as prescribed for abortive therapy Follow up with Dr Jaynee Eagles or Melida Northington in 1 year    Migraine Headache  A migraine headache is a very strong throbbing pain on one side or both sides of your head. Migraines can also cause other symptoms. Talk with your doctor about what things may bring on (trigger) your migraine headaches. Follow these instructions at home: Medicines  Take over-the-counter and prescription medicines only as told by your doctor.  Do not drive or use heavy machinery while taking prescription pain medicine.  To prevent or treat constipation while you are taking prescription pain medicine, your doctor may recommend that you: ? Drink enough fluid to keep your pee (urine) clear or pale yellow. ? Take over-the-counter or prescription medicines. ? Eat foods that are high in fiber. These include fresh fruits and vegetables, whole grains, and beans. ? Limit foods that are high in fat and processed sugars. These include fried and sweet foods. Lifestyle  Avoid alcohol.  Do not use any products that contain nicotine or tobacco, such as cigarettes and e-cigarettes. If you need help quitting, ask your doctor.  Get at least 8 hours of sleep every night.  Limit your stress. General instructions   Keep a journal to find out what may bring on your migraines. For example, write down: ? What you eat and drink. ? How much sleep you get. ? Any change in what you eat or drink. ? Any change in your medicines.  If you have a migraine: ? Avoid things that make your symptoms worse, such as bright lights. ? It may help to lie down in a dark, quiet room. ? Do not drive or use heavy machinery. ? Ask your doctor what activities are safe for you.  Keep all follow-up visits as told by your doctor. This is important. Contact a doctor if:  You get a migraine that is different or worse than your usual  migraines. Get help right away if:  Your migraine gets very bad.  You have a fever.  You have a stiff neck.  You have trouble seeing.  Your muscles feel weak or like you cannot control them.  You start to lose your balance a lot.  You start to have trouble walking.  You pass out (faint). This information is not intended to replace advice given to you by your health care provider. Make sure you discuss any questions you have with your health care provider. Document Released: 07/01/2008 Document Revised: 06/16/2018 Document Reviewed: 03/10/2016 Elsevier Interactive Patient Education  2019 [employer].

## 2018-10-27 NOTE — Progress Notes (Signed)
[facility]    Provider:  Dr Jaynee Eagles Referring Provider: Shelda Pal* Primary Care Physician:  Patient, No Pcp Per  CC:  Migraines  Interval history 10/27/2018: She returns today for follow up. She is doing great with Amovig. She is having 1-2 migraines a month, usually the week prior to Amovig dose being due. She is using rizatriptan for abortive therapy but is concerned about side effects. She continues propranolol daily. She is tolerating medications well.   Interval history 12/09/2017: Extensive workup and LP normal. MRi brain normal (reviewed images with patient and answered all questions), opening pressure was 16 on LP. She was started on propranolol and Topiramate ER (qudexy). She is doing well. She is on 40,g propranolol and Quedexy 25mg . She is having side effects to Qudexy. Stop the Quedexy due to side effects.  Since last Thursday feels better on the propranolol. Discussed stopping Topiramate, will add on Ajovy.  HPI:  Candice Oconnor is a 48 y.o. female here as a referral from Dr. Nani Ravens for migraines. PMHx migraine.  Headaches started in her early 2s, were triggered by stress and poor eating. She would have them occasionally. For the past 3 months, headaches have been more frequent and now she has daily headaches. Imitrex doesn;t help. On the 21st, eye pain woke her up in the middle of the night. She had an MRI brain, she saw her eye doctor, she saw ophthalmology and also her primary care. MRI brain was negative. Her migraines are in the frontal area and on the top of her head on left, light sensitivity, nausea, pulsating like a heart beat and sharp pains, constant. A wet cloth and laying in the dark help, they can last 24 hours. She has other headaches, more pressure on the right side of the head, constant dull, she has mild continuous light sensitivity and some mild nausea. Can wax and wane but it has been continuous since the 21st and right eye pain. She  has tried imitrex, ibuprofen, she was started on propranolol and is feeling better. Sister has migraines. No medication overuse. She is aware of rebound headaches. Vision is worsening but she denies diplopia, vision is blurry, she feels like her ears are full, lots of pressure, No other focal neurologic deficits, associated symptoms, inciting events or modifiable factors.  Reviewed notes, labs and imaging from outside physicians, which showed:  Personally reviewed MRI images, normal  Cbc/cmp normal  Review of Systems: Patient complains of symptoms per HPI as well as the following symptoms: confusion, headache, insomnia, restless legs. . Pertinent negatives and positives per HPI. All others negative.   Social History   Socioeconomic History  . Marital status: Divorced    Spouse name: Not on file  . Number of children: 1  . Years of education: Not on file  . Highest education level: Associate degree: occupational, Hotel manager, or vocational program  Occupational History  . Not on file  Social Needs  . Financial resource strain: Not on file  . Food insecurity:    Worry: Not on file    Inability: Not on file  . Transportation needs:    Medical: Not on file    Non-medical: Not on file  Tobacco Use  . Smoking status: Never Smoker  . Smokeless tobacco: Never Used  Substance and Sexual Activity  . Alcohol use: No  . 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 Sexual activity: Not Currently    Partners: Male  Lifestyle  . Physical activity:    Days per week: Not on file    Minutes per session: Not on file  . Stress: Not on file  Relationships  . Social connections:    Talks on phone: Not on file    Gets together: Not on file    Attends religious service: Not on file    Active member of club or organization: Not on file    Attends meetings of clubs or organizations: Not on file    Relationship status: Not on file  . Intimate partner violence:    Fear of  current or ex partner: Not on file    Emotionally abused: Not on file    Physically abused: Not on file    Forced sexual activity: Not on file  Other Topics Concern  . Not on file  Social History Narrative   Lives at home with her child   Right handed   Drinks 1 cup of caffeine daily    Family History  Problem Relation Age of Onset  . Heart disease Mother   . Diabetes Mother   . Heart disease Father   . Diabetes Father   . Breast cancer Neg Hx     Past Medical History:  Diagnosis Date  . Chicken pox   . HPV in female   . Hyperlipidemia   . Migraine     Past Surgical History:  Procedure Laterality Date  . EPIDURAL BLOOD [facility]  11/16/2017  . LUMBAR PUNCTURE  11/12/2017    Current Outpatient Medications  Medication Sig Dispense Refill  . albuterol (PROVENTIL HFA;VENTOLIN HFA) 108 (90 BASE) MCG/ACT inhaler Inhale 1-2 puffs into the lungs every 6 (six) hours as needed for wheezing or shortness of breath.    Marland Kitchen azelastine (ASTELIN) 0.1 % nasal spray Place 2 sprays into both nostrils 2 (two) times daily. Use in each nostril as directed 30 mL 12  . CAMILA 0.35 MG tablet Take 1 tablet by mouth daily.  4  . Erenumab-aooe (AIMOVIG 140 DOSE) 70 MG/ML SOAJ Inject 140 mg into the skin every 30 (thirty) days. 2 pen 11  . montelukast (SINGULAIR) 10 MG tablet Take 10 mg by mouth at bedtime.    . ondansetron (ZOFRAN ODT) 4 MG disintegrating tablet Take 1 tablet (4 mg total) by mouth every 8 (eight) hours as needed for nausea or vomiting. 6 tablet 0  . propranolol (INDERAL) 40 MG tablet Take 1 tablet (40 mg total) by mouth 3 (three) times daily. 90 tablet 2  . rizatriptan (MAXALT-MLT) 10 MG disintegrating tablet TAKE 1 TABLET AS NEEDED FOR MIGRAINE. MAY REPEAT IN 2HRS IF NEEDED 10 tablet 11   No current facility-administered medications for this visit.     Allergies as of 10/27/2018 - Review Complete 10/27/2018  Allergen Reaction Noted  . Other Swelling 06/21/2014  . Tetanus toxoids  Other (See Comments) 12/16/2017    Vitals: BP 118/73   Pulse 83   Resp 16   Ht 5\' 3"  (1.6 m)   Wt 244 lb (110.7 kg)   BMI 43.22 kg/m  Last Weight:  Wt Readings from Last 1 Encounters:  10/27/18 244 lb (110.7 kg)   Last Height:   Ht Readings from Last 1 Encounters:  10/27/18 5\' 3"  (1.6 m)    Physical exam: Exam: Gen: NAD, conversant, well nourised, obese, well groomed                     CV: RRR, no  MRG. No Carotid Bruits. No peripheral edema, warm, nontender Eyes: Conjunctivae clear without exudates or hemorrhage  Neuro: Detailed Neurologic Exam  Speech:    Speech is normal; fluent and spontaneous with normal comprehension.  Cognition:    The patient is oriented to person, place, and time;     recent and remote memory intact;     language fluent;     normal attention, concentration,     fund of knowledge Cranial Nerves:    The pupils are equal, round, and reactive to light. The fundi are normal and spontaneous venous pulsations are present. Visual fields are full to finger confrontation. Extraocular movements are intact. Trigeminal sensation is intact and the muscles of mastication are normal. The face is symmetric. Hearing intact. Voice is normal. Shoulder shrug is normal.   Gait:     gait normal.   Motor Observation:    No asymmetry, no atrophy, and no involuntary movements noted.  Tone:    Normal muscle tone.    Posture:    Posture is normal. normal erect    Strength:    Strength is V/V in the upper and lower limbs.      Sensation: intact to LT       Assessment/Plan:  48 year old with progressively worsening headache, daily, more pressure quality. She has a PMHx of migraines, obesity. May be status migrainosus and will try and treat with a taper of steroids but suspicious of IIH will order lumbar puncture.   DOing excellent on Aimovig, continue  Extensive workup and LP normal. MRi brain normal (reviewed images with patient and answered all  questions), opening pressure was 16 on LP. She was started on propranolol and Topiramate ER (qudexy).   Also need to consider Sleep Apnea which can cause worsening, negative intractable headaches however ESS was <10 Healthy Weight and [place] [facility] for [facility], highly recommend  - Doing well on propranolol and Amovig. Stop rizatriptan and start ubrogepant as prescribed. Follow up in 1 year with Dr Jaynee Eagles or Amy.     Discussed: To prevent or relieve headaches, try the following: Cool Compress. Lie down and place a cool compress on your head.  Avoid headache triggers. If certain foods or odors seem to have triggered your migraines in the past, avoid them. A headache diary might help you identify triggers.  Include physical activity in your daily routine. Try a daily walk or other moderate aerobic exercise.  Manage stress. Find healthy ways to cope with the stressors, such as delegating tasks on your to-do list.  Practice relaxation techniques. Try deep breathing, yoga, massage and visualization.  Eat regularly. Eating regularly scheduled meals and maintaining a healthy diet might help prevent headaches. Also, drink plenty of fluids.  Follow a regular sleep schedule. Sleep deprivation might contribute to headaches Consider biofeedback. With this mind-body technique, you learn to control certain bodily functions - such as muscle tension, heart rate and blood pressure - to prevent headaches or reduce headache pain.    Proceed to emergency room if you experience new or worsening symptoms or symptoms do not resolve, if you have new neurologic symptoms or if headache is severe, or for any concerning symptom.   Provided education and documentation from American headache Society toolbox including articles on: chronic migraine medication overuse headache, chronic migraines, prevention of migraines, behavioral and other nonpharmacologic treatments for headache.  A total of 25 minutes was  spent in with this patient face-to-face. Over half this time was  spent on counseling patient on the No diagnosis found. diagnosis and different therapeutic options available.    Sarina Ill, MD  [facility] [address] [place], [place] [address]  Phone [phone] Fax [phone]

## 2018-10-28 DIAGNOSIS — G43709 Chronic migraine without aura, not intractable, without status migrainosus: Secondary | ICD-10-CM | POA: Insufficient documentation

## 2018-12-02 ENCOUNTER — Telehealth: Payer: Self-pay | Admitting: *Deleted

## 2018-12-02 ENCOUNTER — Encounter: Payer: Self-pay | Admitting: *Deleted

## 2018-12-02 NOTE — Telephone Encounter (Signed)
Candice Oconnor has been denied by insurance. Drug is only covered if pt meets the following: have tried or cannot take 1. Divalproex DR 2. Topiramate 3. Emgality.   Pt should have copay card to get it free regardless of insurance denial. Will send pt a mychart message.

## 2018-12-02 NOTE — Telephone Encounter (Signed)
Completed Ubrelvy 50 mg PA on Cover My Meds. KEY: [id number]. Awaiting [facility] Rx determination within 72 hours.

## 2018-12-13 ENCOUNTER — Other Ambulatory Visit: Payer: Self-pay | Admitting: Neurology

## 2018-12-13 ENCOUNTER — Telehealth: Payer: Self-pay | Admitting: Neurology

## 2018-12-13 MED ORDER — ERENUMAB-AOOE 70 MG/ML ~~LOC~~ SOAJ: 140.0000 mg | SUBCUTANEOUS | 11 refills | Status: DC

## 2018-12-13 NOTE — Telephone Encounter (Signed)
Patient called in for a refill of her Erenumab-aooe (AIMOVIG 140 DOSE) 70 MG/ML SOAJ. To be sent to  [id number] - [place], [place] [address]. AT [address] [phone] (Phone) [phone] (Fax)

## 2018-12-13 NOTE — Telephone Encounter (Signed)
Aimovig 140 mg refilled.

## 2018-12-14 ENCOUNTER — Telehealth: Payer: Self-pay | Admitting: Neurology

## 2018-12-14 NOTE — Telephone Encounter (Signed)
Gave pt Aimovig copay card. She verbalized appreciation.

## 2018-12-14 NOTE — Telephone Encounter (Signed)
°  Patient in the lobby requesting a discount card for Aimovig. Best call back [phone]

## 2018-12-27 ENCOUNTER — Emergency Department (HOSPITAL_COMMUNITY): Payer: 59

## 2018-12-27 ENCOUNTER — Emergency Department (HOSPITAL_COMMUNITY)
Admission: EM | Admit: 2018-12-27 | Discharge: 2018-12-27 | Disposition: A | Discharge status: Home / Self Care | Payer: 59 | Attending: Emergency Medicine | Admitting: Emergency Medicine

## 2018-12-27 ENCOUNTER — Other Ambulatory Visit: Payer: Self-pay

## 2018-12-27 DIAGNOSIS — R0789 Other chest pain: Secondary | ICD-10-CM | POA: Insufficient documentation

## 2018-12-27 DIAGNOSIS — R079 Chest pain, unspecified: Secondary | ICD-10-CM | POA: Diagnosis present

## 2018-12-27 DIAGNOSIS — Z79899 Other long term (current) drug therapy: Secondary | ICD-10-CM | POA: Diagnosis not present

## 2018-12-27 LAB — CBC WITH DIFFERENTIAL/PLATELET
ABS IMMATURE GRANULOCYTES: 0.06 10*3/uL (ref 0.00–0.07)
BASOS PCT: 1 %
Basophils Absolute: 0.1 10*3/uL (ref 0.0–0.1)
EOS ABS: 0.3 10*3/uL (ref 0.0–0.5)
EOS PCT: 3 %
HCT: 37.9 % (ref 36.0–46.0)
Hemoglobin: 12.4 g/dL (ref 12.0–15.0)
Immature Granulocytes: 1 %
Lymphocytes Relative: 18 %
Lymphs Abs: 1.8 10*3/uL (ref 0.7–4.0)
MCH: 27.7 pg (ref 26.0–34.0)
MCHC: 32.7 g/dL (ref 30.0–36.0)
MCV: 84.8 fL (ref 80.0–100.0)
MONO ABS: 0.7 10*3/uL (ref 0.1–1.0)
MONOS PCT: 7 %
Neutro Abs: 7 10*3/uL (ref 1.7–7.7)
Neutrophils Relative %: 70 %
PLATELETS: 276 10*3/uL (ref 150–400)
RBC: 4.47 MIL/uL (ref 3.87–5.11)
RDW: 13.2 % (ref 11.5–15.5)
WBC: 9.9 10*3/uL (ref 4.0–10.5)
nRBC: 0 % (ref 0.0–0.2)

## 2018-12-27 LAB — COMPREHENSIVE METABOLIC PANEL
ALT: 17 U/L (ref 0–44)
AST: 16 U/L (ref 15–41)
Albumin: 3.5 g/dL (ref 3.5–5.0)
Alkaline Phosphatase: 53 U/L (ref 38–126)
Anion gap: 10 (ref 5–15)
BUN: 10 mg/dL (ref 6–20)
CHLORIDE: 102 mmol/L (ref 98–111)
CO2: 24 mmol/L (ref 22–32)
CREATININE: 0.8 mg/dL (ref 0.44–1.00)
Calcium: 9.5 mg/dL (ref 8.9–10.3)
Glucose, Bld: 95 mg/dL (ref 70–99)
Potassium: 3.7 mmol/L (ref 3.5–5.1)
Sodium: 136 mmol/L (ref 135–145)
Total Bilirubin: 0.4 mg/dL (ref 0.3–1.2)
Total Protein: 7 g/dL (ref 6.5–8.1)

## 2018-12-27 LAB — I-STAT TROPONIN, ED
TROPONIN I, POC: 0 ng/mL (ref 0.00–0.08)
Troponin i, poc: 0 ng/mL (ref 0.00–0.08)

## 2018-12-27 NOTE — ED Notes (Signed)
Patient Alert and oriented to baseline. Stable and ambulatory to baseline. Patient verbalized understanding of the discharge instructions.  Patient belongings were taken by the patient.   

## 2018-12-27 NOTE — ED Provider Notes (Signed)
I called the patient and left a voicemail regarding EKG findings. EKG shows Normal sinus rhythm at 66 bpm, with upsloping QRS complexes consistent with delta wave, however no shortened PR interval to truly indicate WPW.  I also do not feel that her symptoms of intermittent dyspnea are attributable to degeneration of WPW into ventricular tachycardia.  As there is no prior EKGs for comparison, unsure of the clinical significance of this.  I encouraged her to contact her primary care physician for repeat EKG and possible cardiology follow up.  Tommie Raymond, MD 12/28/18 [name], [place], DO 12/28/18 1517

## 2018-12-27 NOTE — ED Triage Notes (Addendum)
Per EMS: pt here from PCP with c/o CP that began 1 week ago and abnormal EKG.   Pt states the pain is worse with exertion and stress. Also endorses nausea with no vomiting.  Pt currently on a Z-Pack for presumed PNA from PCP with no evaluation.    EMS vitals:  BP 160/92 HR 86 RR 18 O2 98% RA Temp 98.6

## 2018-12-27 NOTE — ED Provider Notes (Signed)
[facility] EMERGENC[facility] DEPARTMENT Provider Note   CSN: [id number] Arrival date & time: 12/27/18  1757    History   Chief Complaint No chief complaint on file.   HPI Candice Oconnor is a 48 y.o. female.      Chest Pain  Chest pain location: Diffusely across chest. Pain quality: dull   Pain radiates to:  Does not radiate Pain severity:  Moderate Onset quality:  Gradual Duration:  1 week Timing:  Constant Progression:  Unchanged Chronicity:  New Context: stress   Context: not breathing   Relieved by:  Nothing Worsened by:  Exertion Ineffective treatments:  None tried Associated symptoms: shortness of breath   Associated symptoms: no abdominal pain, no back pain, no cough, no fever, no palpitations and no vomiting     Past Medical History:  Diagnosis Date   Chicken pox    HPV in female    Hyperlipidemia    Migraine     Patient Active Problem List   Diagnosis Date Noted   Chronic migraine without aura without status migrainosus, not intractable 10/28/2018   Blurry vision, bilateral 10/26/2017   Frequent headaches 10/26/2017   Mixed hyperlipidemia 08/31/2017   Morbid obesity ([place]) 03/03/2012   Heavy periods 03/03/2012   Back pain 03/03/2012   Urinary tract infection 03/03/2012    Past Surgical History:  Procedure Laterality Date   EPIDURAL BLOOD PATCH  11/16/2017   LUMBAR PUNCTURE  11/12/2017     OB History    Gravida  2   Para  1   Term      Preterm      AB      Living        SAB      TAB      Ectopic      Multiple      Live Births               Home Medications    Prior to Admission medications   Medication Sig Start Date End Date Taking? Authorizing Provider  albuterol (PROVENTIL HFA;VENTOLIN HFA) 108 (90 BASE) MCG/ACT inhaler Inhale 1-2 puffs into the lungs every 6 (six) hours as needed for wheezing or shortness of breath (or for seasonal allergies).    Yes [provider]    azelastine (ASTELIN) 0.1 % nasal spray Place 2 sprays into both nostrils 2 (two) times daily as needed (for seasonal allergies).  09/07/17  Yes Shelda Pal, DO  azithromycin (ZITHROMAX) 250 MG tablet Take 250-500 mg by mouth See admin instructions. Take 500 mg by mouth on day one, then 250 mg once a day on days 2-5   Yes [provider]  CAMILA 0.35 MG tablet Take 1 tablet by mouth See admin instructions. Take 1 tablet by mouth once a day as directed 11/03/17  Yes [provider]  Erenumab-aooe (AIMOVIG, 140 MG DOSE,) 70 MG/ML SOAJ Inject 140 mg into the skin every 30 (thirty) days. 12/13/18  Yes Melvenia Beam, MD  ibuprofen (ADVIL,MOTRIN) 200 MG tablet Take 200-400 mg by mouth every 6 (six) hours as needed (FOR PAIN).   Yes [provider]  montelukast (SINGULAIR) 10 MG tablet Take 10 mg by mouth at bedtime as needed (for seasonal "flares").    Yes [provider]  ondansetron (ZOFRAN ODT) 4 MG disintegrating tablet Take 1 tablet (4 mg total) by mouth every 8 (eight) hours as needed for nausea or vomiting. 06/13/18  Yes Recardo Evangelist,  PA-C  propranolol (INDERAL) 40 MG tablet Take 1 tablet (40 mg total) by mouth 3 (three) times daily. 07/08/18  Yes Shelda Pal, DO  rizatriptan (MAXALT-MLT) 10 MG disintegrating tablet TAKE 1 TABLET AS NEEDED FOR MIGRAINE. MAY REPEAT IN 2HRS IF NEEDED Patient taking differently: Take 10 mg by mouth as needed for migraine (AND MAY REPEAT ONCE IN 2 HOURS, IF NO RELIEF).  02/16/18  Yes Melvenia Beam, MD  Ubrogepant (UBRELVY) 50 MG TABS Take 1 tablet by mouth as needed (for headache and may repeat once in 2 hours, if no relief (max of 2/24 hours)).    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  HENT: Negative for ear pain and sore throat.   Eyes: Negative for pain and visual disturbance.  Respiratory: Positive for shortness of breath. Negative for cough.   Cardiovascular: Positive for chest pain. Negative for palpitations.  Gastrointestinal: Negative for abdominal pain and vomiting.  Genitourinary: Negative for dysuria and hematuria.  Musculoskeletal: Negative for arthralgias and back pain.  Skin: Negative for color change and rash.  Neurological: Negative for seizures and syncope.  All other systems reviewed and are negative.    Physical Exam Updated Vital Signs BP 118/72 (BP Location: Right Arm)    Pulse 86    Temp 98.2 F (36.8 C) (Oral)    Resp 16    Ht 5\' 3"  (1.6 m)    Wt 111.1 kg    SpO2 98%    BMI 43.40 kg/m   Physical Exam Vitals signs and nursing note reviewed.  Constitutional:      General: She is not in acute distress.    Appearance: She is well-developed.  HENT:     Head: Normocephalic and atraumatic.  Eyes:     Conjunctiva/sclera: Conjunctivae normal.  Neck:     Musculoskeletal: Neck supple.  Cardiovascular:     Rate and Rhythm: Normal rate and regular rhythm.     Heart sounds: No murmur.  Pulmonary:     Effort: Pulmonary effort is normal. No respiratory distress.     Breath sounds: Normal breath sounds.  Abdominal:     Palpations: Abdomen is soft.     Tenderness: There is no abdominal tenderness.  Musculoskeletal: Normal range of motion.        General: No swelling or tenderness.     Right lower leg: No edema.     Left lower leg: No edema.  Skin:    General: Skin is warm and dry.     Findings: No rash.    Neurological:     General: No focal deficit present.     Mental Status: She is alert and oriented to person, place, and time.     Cranial Nerves: No cranial nerve deficit.      ED Treatments / Results  Labs (all  labs ordered are listed, but only abnormal results are displayed) Labs Reviewed  CBC WITH DIFFERENTIAL/PLATELET  COMPREHENSIVE METABOLIC PANEL  I-STAT TROPONIN, ED  I-STAT TROPONIN, ED    EKG EKG Interpretation  Date/Time:  Monday December 27 2018 18:07:03 EDT Ventricular Rate:  66 PR Interval:  130 QRS Duration: 100 QT Interval:  418 QTC Calculation: 438 R Axis:   69 Text Interpretation:  Normal sinus rhythm with sinus arrhythmia Wolff-Parkinson-White Abnormal ECG No old tracing to compare Confirmed by Deno Etienne [phone]) on 12/27/2018 6:16:02 PM   Radiology Dg Chest 2 View  Result Date: 12/27/2018 CLINICAL DATA:  Chest pain EXAM: CHEST - 2 VIEW COMPARISON:  None. FINDINGS: Lungs are clear. Heart size and pulmonary vascularity are normal. No adenopathy. No pneumothorax. No bone lesions. IMPRESSION: No edema or consolidation. Electronically Signed   By: Lowella Grip III M.D.   On: 12/27/2018 19:01    Procedures Procedures (including critical care time)  Medications Ordered in ED Medications - No data to display   Initial Impression / Assessment and Plan / ED Course  I have reviewed the triage vital signs and the nursing notes.  Pertinent labs & imaging results that were available during my care of the patient were reviewed by me and considered in my medical decision making (see chart for details).        Patient is a 48 year old female who presents to the emergency department complaining of diffuse chest pain of her bilateral chest now for the past week.  Patient states initially started off intermittently however has progressed to constant in nature for the past 3 days.  She has difficulty explaining exactly what the pain feels like and states that she  feels as if she is breathing through water.    On initial evaluation of the patient she was hemodynamically stable and nontoxic-appearing.  Patient was afebrile, not tachycardic, normotensive, satting 100% on room air.  Physical exam as detailed above which is remarkable for clear lung sounds to auscultation bilaterally.  No murmurs rubs or gallops appreciated on auscultation of the heart.  Equal pulses in all 4 extremities with no evidence of peripheral edema.  Patient also has no overlying erythematous or vesicular lesions in the region she is complaining of pain.  HEART score of 3. Low risk for ACS.  PERC negative with no risk factors for thromboembolic disease such as recent immobilization, exogenous estrogen use, unilateral lower extremity swelling, or history of prior blood clots.  Doubt PE or DVT at this time.  CBC and CMP unremarkable.  Chest x-ray with no acute cardiac or pulmonary pathology.  Initial troponin undetectable.  Patient symptoms most likely secondary to increasing stress at home with having to work from home with her young children present.  Given her low risk for ACS with HEART score of 3 will obtain repeat troponin at 3 hour mark. If negative patient safe to return home.   Repeat troponin undetectable.  Patient was reassured with these results.  She thinks that increasing stress at home may be related to her symptoms, as she is now having to work from home while attempting to take care of her young children.  I discussed concerning signs and symptoms that would necessitate return to the emergency department.  Patient voiced understanding of these instructions and had no further questions at this time.  Final Clinical Impressions(s) / ED Diagnoses   Final diagnoses:  Atypical chest pain    ED Discharge Orders    None  Tommie Raymond, MD 12/27/18 [place], [place], DO 12/27/18 [phone]

## 2018-12-28 ENCOUNTER — Telehealth: Payer: Self-pay | Admitting: Cardiology

## 2018-12-28 NOTE — Telephone Encounter (Signed)
RETURNED Candice Oconnor IS THE SAM MAYBE WORSE TODAY. SHE STATES THAT HER COWORKERS THINK THAT SHE HAS BEEN EXPOSED TO COVID-19 SINCE SHE HAS BEEN TO [facility] SO THIS IS CAUSING HER ADDITIONAL STRESS. 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 I  HAVE READ ER NOTE AND REASSURED HER THAT IS MAY NOT BE CARDIAC RELATED AND THAT I WILL FOR WARD TO DOD FOR HER ADVISE AND FURTHER INSTRUCTIONS.

## 2018-12-28 NOTE — Telephone Encounter (Signed)
New Message:    Pt was seen in [facility] ER yesterday(12-27-18). She was told to follow up with our Cardiology Group. She will be a new patient for [place]. Pt says she seems to be getting worse, She would like to be seen asap please.

## 2018-12-28 NOTE — Telephone Encounter (Signed)
Patient is scheduled to see Dr. Mamie Nick (cardiology group) tomorrow.

## 2018-12-28 NOTE — Telephone Encounter (Addendum)
Left message to call back  

## 2018-12-29 ENCOUNTER — Encounter: Payer: Self-pay | Admitting: Cardiology

## 2018-12-29 ENCOUNTER — Other Ambulatory Visit: Payer: Self-pay

## 2018-12-29 ENCOUNTER — Ambulatory Visit (INDEPENDENT_AMBULATORY_CARE_PROVIDER_SITE_OTHER): Payer: 59 | Admitting: Cardiology

## 2018-12-29 VITALS — BP 131/73 | HR 82 | Ht 63.0 in | Wt 244.9 lb

## 2018-12-29 DIAGNOSIS — I456 Pre-excitation syndrome: Secondary | ICD-10-CM

## 2018-12-29 DIAGNOSIS — R072 Precordial pain: Secondary | ICD-10-CM | POA: Diagnosis not present

## 2018-12-29 NOTE — Progress Notes (Signed)
Patient referred by [facility] for chest pain  Subjective:   Candice Oconnor, female    DOB: 1971/06/07, 48 y.o.   MRN: [id number]   Chief Complaint  Patient presents with  . New Patient (Initial Visit)  . Chest Pain     HPI  48 y.o. Caucasian female with h/o migraines, morbid obesity, family h/o premature CAD, now referred for chest pain.  Patient works as an Event organiser, which includes physical work.  Patient has had pain across her chest for last 1-2 weeks.  Pain is present at rest, and is constant.  She has noticed that it increases with stress.  There is no specific exertional component to the chest pain.  Patient was initially treated with a Z-Pak for pneumonia.  However, she denies any fever, chills, cough.  Recent chest x-ray performed in the ED is normal.  Troponin was normal x2 in the ED on 12/27/2018.  Patient denies any palpitations, presyncope or syncope.   Past Medical History:  Diagnosis Date  . Chicken pox   . HPV in female   . Hyperlipidemia   . Migraine      Past Surgical History:  Procedure Laterality Date  . EPIDURAL BLOOD [facility]  11/16/2017  . LUMBAR PUNCTURE  11/12/2017     Social History   Socioeconomic History  . Marital status: Divorced    Spouse name: Not on file  . Number of children: 1  . Years of education: Not on file  . Highest education level: Associate degree: occupational, Hotel manager, or vocational program  Occupational History  . Not on file  Social Needs  . Financial resource strain: Not on file  . Food insecurity:    Worry: Not on file    Inability: Not on file  . Transportation needs:    Medical: Not on file    Non-medical: Not on file  Tobacco Use  . Smoking status: Never Smoker  . Smokeless tobacco: Never Used  Substance and Sexual Activity  . Alcohol use: Yes    Comment: occ  . 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 Sexual activity: Not Currently    Partners: Male   Lifestyle  . Physical activity:    Days per week: Not on file    Minutes per session: Not on file  . Stress: Not on file  Relationships  . Social connections:    Talks on phone: Not on file    Gets together: Not on file    Attends religious service: Not on file    Active member of club or organization: Not on file    Attends meetings of clubs or organizations: Not on file    Relationship status: Not on file  . Intimate partner violence:    Fear of current or ex partner: Not on file    Emotionally abused: Not on file    Physically abused: Not on file    Forced sexual activity: Not on file  Other Topics Concern  . Not on file  Social History Narrative   Lives at home with her child   Right handed   Drinks 1 cup of caffeine daily     Current Outpatient Medications on File Prior to Visit  Medication Sig Dispense Refill  . albuterol (PROVENTIL HFA;VENTOLIN HFA) 108 (90 BASE) MCG/ACT inhaler Inhale 1-2 puffs into the lungs every 6 (six) hours as needed for wheezing or shortness of breath (or for seasonal allergies).     Marland Kitchen  azelastine (ASTELIN) 0.1 % nasal spray Place 2 sprays into both nostrils 2 (two) times daily as needed (for seasonal allergies).  30 mL 12  . Erenumab-aooe (AIMOVIG, 140 MG DOSE,) 70 MG/ML SOAJ Inject 140 mg into the skin every 30 (thirty) days. 2 pen 11  . ibuprofen (ADVIL,MOTRIN) 200 MG tablet Take 200-400 mg by mouth every 6 (six) hours as needed (FOR PAIN).    Marland Kitchen montelukast (SINGULAIR) 10 MG tablet Take 10 mg by mouth at bedtime as needed (for seasonal "flares").     . ondansetron (ZOFRAN ODT) 4 MG disintegrating tablet Take 1 tablet (4 mg total) by mouth every 8 (eight) hours as needed for nausea or vomiting. 6 tablet 0  . pramipexole (MIRAPEX) 0.5 MG tablet Take 0.5 mg by mouth as needed.    . propranolol (INDERAL) 40 MG tablet Take 1 tablet (40 mg total) by mouth 3 (three) times daily. 90 tablet 2  . rizatriptan (MAXALT-MLT) 10 MG disintegrating tablet TAKE 1  TABLET AS NEEDED FOR MIGRAINE. MAY REPEAT IN 2HRS IF NEEDED (Patient taking differently: Take 10 mg by mouth as needed for migraine (AND MAY REPEAT ONCE IN 2 HOURS, IF NO RELIEF). ) 10 tablet 11  . azithromycin (ZITHROMAX) 250 MG tablet Take 250-500 mg by mouth See admin instructions. Take 500 mg by mouth on day one, then 250 mg once a day on days 2-5    . CAMILA 0.35 MG tablet Take 1 tablet by mouth See admin instructions. Take 1 tablet by mouth once a day as directed  4  . Ubrogepant (UBRELVY) 50 MG TABS Take 1 tablet by mouth as needed (for headache and may repeat once in 2 hours, if no relief (max of 2/24 hours)).      No current facility-administered medications on file prior to visit.     Cardiovascular studies:  EKG 12/29/2018: Sinus rhythm  WPW pattern.  No change compared to previous EKG 12/27/2018  Recent labs: Performed through her work.  Not available to me.  This includes lipid panel.  Review of Systems  Constitution: Negative for decreased appetite, malaise/fatigue, weight gain and weight loss.  HENT: Negative for congestion.   Eyes: Negative for visual disturbance.  Cardiovascular: Positive for chest pain. Negative for dyspnea on exertion, leg swelling, palpitations and syncope.  Respiratory: Negative for shortness of breath.   Endocrine: Negative for cold intolerance.  Hematologic/Lymphatic: Does not bruise/bleed easily.  Skin: Negative for itching and rash.  Musculoskeletal: Negative for myalgias.  Gastrointestinal: Negative for abdominal pain, nausea and vomiting.  Genitourinary: Negative for dysuria.  Neurological: Positive for headaches (Migraine). Negative for dizziness and weakness.  Psychiatric/Behavioral: The patient is nervous/anxious.   All other systems reviewed and are negative.        Vitals:   12/29/18 1023  BP: 131/73  Pulse: 82  SpO2: 99%    Objective:   Physical Exam  Constitutional: She is oriented to person, place, and time. She  appears well-developed and well-nourished. No distress.  HENT:  Head: Normocephalic and atraumatic.  Eyes: Pupils are equal, round, and reactive to light. Conjunctivae are normal.  Neck: No JVD present.  Cardiovascular: Normal rate, regular rhythm and intact distal pulses.  No murmur heard. Pulmonary/Chest: Effort normal and breath sounds normal. She has no wheezes. She has no rales.  Chest wall tenderness  Abdominal: Soft. Bowel sounds are normal. There is no rebound.  Musculoskeletal:        General: No edema.  Lymphadenopathy:  She has no cervical adenopathy.  Neurological: She is alert and oriented to person, place, and time. No cranial nerve deficit.  Skin: Skin is warm and dry.  Psychiatric: She has a normal mood and affect.  Nursing note and vitals reviewed.         Assessment & Recommendations:   48 y.o. Caucasian female with h/o migraines, morbid obesity, family h/o premature CAD, now referred for chest pain.  1. Precordial pain While she has strong family history of premature CAD, her chest pain is noncardiac in nature.  Suspect musculoskeletal etiology.  If pain is not better in 8 weeks from now, could consider exercise treadmill stress test.  If pain is resolved, could then consider calcium score testing for re-stratification, given her family history.  At present, neither of these could be performed due to restriction on elective testing in light of COVID-19 outbreak.   2. Wolff-Parkinson-White (WPW) pattern seen on electrocardiography Incidental finding.  Resting heart rate 70.  I suspect her accessory pathway may not be robust enough to conduct at faster rates, given absence of any palpitations, presyncope or syncope symptoms.  This does not need any treatment.  If she undergoes exercise treadmill stress test in future for chest pain, this will help risk stratify her further.   Return visit in 8 weeks.  Could consider virtual visit at that time.  Thank you for  referring the patient to [place]. Please feel free to contact with any questions.  Nigel Mormon, MD [facility] Pager: [phone] Office: [phone] If no answer Cell [phone]

## 2018-12-29 NOTE — Telephone Encounter (Signed)
Patient seen by another cardiology office today

## 2018-12-31 ENCOUNTER — Encounter: Payer: Self-pay | Admitting: Neurology

## 2018-12-31 ENCOUNTER — Telehealth: Payer: Self-pay | Admitting: Neurology

## 2018-12-31 NOTE — Telephone Encounter (Signed)
Spoke with pt. 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 She has had chest pain. She saw cardiology. She said that the cardiologist said the propranolol can make it worse and he advised to come off of it.

## 2018-12-31 NOTE — Telephone Encounter (Signed)
Pt states she saw her cardiologist this week and it was suggested she comes off her propranolol (INDERAL) 40 MG tablet.  Pt is asking for a call to discuss

## 2018-12-31 NOTE — Telephone Encounter (Signed)
Patient did agree to a telephone visit with Amy on Monday 01/03/2019 @11 :30 AM. I received her consent to file insurance. She is aware that she will not be charged a copay. I updated her medications, allergies,med/sx/soc/fam hx. She stated she will go ahead and stop the Propranolol unless otherwise told. She verbalized appreciation.   She also would like to discuss during visit that Ubrelvy 50 mg didn't seem to work, however Rizatriptan usually does.

## 2018-12-31 NOTE — Telephone Encounter (Signed)
Ask if she is interested in a web ex appointment with me or a phone call with amy. But I think she should go off of it as suggested by cardiology and if the migraines worsen then we try something else, that is really the only think to do I don;t want to do 2 things at once.

## 2018-12-31 NOTE — Telephone Encounter (Addendum)
Per Dr. Jaynee Eagles, patient should decrease her propranolol to BID until her appt with Amy and then further discuss at appt. Called pt and discussed plan. She verbalized understanding and appreciation.

## 2018-12-31 NOTE — Addendum Note (Signed)
Addended by: Gildardo Griffes on: 12/31/2018 01:03 PM   Modules accepted: Orders

## 2019-01-03 ENCOUNTER — Other Ambulatory Visit: Payer: Self-pay

## 2019-01-03 ENCOUNTER — Ambulatory Visit (INDEPENDENT_AMBULATORY_CARE_PROVIDER_SITE_OTHER): Payer: 59 | Admitting: Family Medicine

## 2019-01-03 ENCOUNTER — Encounter: Payer: Self-pay | Admitting: Family Medicine

## 2019-01-03 DIAGNOSIS — G43709 Chronic migraine without aura, not intractable, without status migrainosus: Secondary | ICD-10-CM

## 2019-01-03 DIAGNOSIS — I456 Pre-excitation syndrome: Secondary | ICD-10-CM | POA: Insufficient documentation

## 2019-01-03 MED ORDER — VENLAFAXINE HCL ER 37.5 MG PO CP24: 37.5000 mg | ORAL_CAPSULE | Freq: Every day | ORAL | 11 refills | Status: DC

## 2019-01-03 NOTE — Patient Instructions (Signed)
Wolff-Parkinson-White Syndrome  Wolff-Parkinson-White (WPW) syndrome is a heart condition that causes a fast and irregular heartbeat (arrhythmia). The arrhythmia comes and goes suddenly. This condition is congenital. This means that people who have WPW were born with it. However, symptoms may not appear until the teen or adult years. What are the causes? This condition is caused by an extra electrical connection (pathway) between the top chambers of your heart (atria) and the bottom chambers of your heart (ventricles). This can cause an abnormal heart rhythm that comes and goes. What increases the risk? This condition is more likely to develop in people who:  Have a family history of WPW.  Have another congenital heart defect.  Are female.  Are less than 84 years old. What are the signs or symptoms? Symptoms of this condition include:  Feeling your heart "skip" beats (palpitations).  A fast heart rate.  Shortness of breath.  Light-headedness or dizziness.  Fatigue, especially with exercise.  Anxiety.  Chest pain.  Fainting.  Irritability and trouble feeding in young children with the condition.  Stopping of your heart (cardiac arrest). This is rare. Symptoms may start suddenly and last for several minutes or a few hours. They are often triggered by exercise. In some cases, there are no symptoms. How is this diagnosed? This condition is diagnosed based on:  Your medical history.  A physical exam. You may also have tests, including:  Electrocardiogram (ECG). This test checks the electrical activity of your heart.  Echocardiogram. This test checks your heart motion, valves, and blood flow.  Ambulatory cardiac monitoring. This is a portable ECG that you wear. It checks your heart's rhythm.  Stress testing. This test is an ECG that is done during exercise.  Electrophysiology study. This test measures electrical activity in your heart. It uses a thin tube (catheter) that  is inserted in your heart through a blood vessel. How is this treated? Treatment for WPW depends on how often you have symptoms and what type of extra pathway you have. Treatment may include:  Medicine to stop the arrhythmia. You may get this medicine through an IV to stop an attack, or you may take it by mouth to prevent an attack.  Cardioversion. This is a controlled electrical shock. In extreme circumstances, it may be used to return your heart rate to normal.  Radiofrequency ablation. This is a surgical procedure that uses high-frequency radio waves to destroy the extra pathway. In some cases, your health care provider may only watch your condition closely for any changes. Follow these instructions at home:  Take over-the-counter and prescription medicines only as told by your health care provider.  Follow any exercise restrictions as told by your health care provider.  Do not use any products that contain nicotine or tobacco, such as cigarettes and e-cigarettes. If you need help quitting, ask your health care provider.  Do not drink beverages that contain caffeine, such as coffee, soda, and tea.  Do not drink alcohol.  Keep all follow-up visits as told by your health care provider. This is important. Contact a health care provider if:  You are having symptoms of WPW.  Medicines do not control your symptoms. Get help right away if you:  Have chest pain.  Have difficulty breathing.  Pass out. These symptoms may represent a serious problem that is an emergency. Do not wait to see if the symptoms will go away. Get medical help right away. Call your local emergency services (911 in the [place] Do not  drive yourself to the hospital. Summary  Wolff-Parkinson-White (WPW) syndrome is a heart condition that causes a fast and irregular heartbeat (arrhythmia)that comes and goes suddenly.  People who have this condition were born with it. However, symptoms may not appear until the  teen or adult years.  This condition is caused by an extra electrical connection (pathway) between the top chambers of your heart (atria) and the bottom chambers of your heart (ventricles). This can cause an abnormal heart rhythm that comes and goes.  Treatment for WPW depends on how often you have symptoms and what type of extra pathway you have. This information is not intended to replace advice given to you by your health care provider. Make sure you discuss any questions you have with your health care provider. Document Released: 12/13/2003 Document Revised: 11/04/2017 Document Reviewed: 11/04/2017 Elsevier Interactive Patient Education  2019 [employer]   Venlafaxine extended-release capsules What is this medicine? VENLAFAXINE(VEN la fax een) is used to treat depression, anxiety and panic disorder. This medicine may be used for other purposes; ask your health care provider or pharmacist if you have questions. COMMON BRAND NAME(S): Effexor XR What should I tell my health care provider before I take this medicine? They need to know if you have any of these conditions: -bleeding disorders -glaucoma -heart disease -high blood pressure -high cholesterol -kidney disease -liver disease -low levels of sodium in the blood -mania or bipolar disorder -seizures -suicidal thoughts, plans, or attempt; a previous suicide attempt by you or a family -take medicines that treat or prevent blood clots -thyroid disease -an unusual or allergic reaction to venlafaxine, desvenlafaxine, other medicines, foods, dyes, or preservatives -pregnant or trying to get pregnant -breast-feeding How should I use this medicine? Take this medicine by mouth with a full glass of water. Follow the directions on the prescription label. Do not cut, crush, or chew this medicine. Take it with food. If needed, the capsule may be carefully opened and the entire contents sprinkled on a spoonful of cool applesauce. Swallow  the applesauce/pellet mixture right away without chewing and follow with a glass of water to ensure complete swallowing of the pellets. Try to take your medicine at about the same time each day. Do not take your medicine more often than directed. Do not stop taking this medicine suddenly except upon the advice of your doctor. Stopping this medicine too quickly may cause serious side effects or your condition may worsen. A special MedGuide will be given to you by the pharmacist with each prescription and refill. Be sure to read this information carefully each time. Talk to your pediatrician regarding the use of this medicine in children. Special care may be needed. Overdosage: If you think you have taken too much of this medicine contact a poison control center or emergency room at once. NOTE: This medicine is only for you. Do not share this medicine with others. What if I miss a dose? If you miss a dose, take it as soon as you can. If it is almost time for your next dose, take only that dose. Do not take double or extra doses. What may interact with this medicine? Do not take this medicine with any of the following medications: -certain medicines for fungal infections like fluconazole, itraconazole, ketoconazole, posaconazole, voriconazole -cisapride -desvenlafaxine -dofetilide -dronedarone -duloxetine -levomilnacipran -linezolid -MAOIs like Carbex, Eldepryl, Marplan, Nardil, and Parnate -methylene blue (injected into a vein) -milnacipran -pimozide -thioridazine -ziprasidone This medicine may also interact with the following medications: -amphetamines -aspirin  and aspirin-like medicines -certain medicines for depression, anxiety, or psychotic disturbances -certain medicines for migraine headaches like almotriptan, eletriptan, frovatriptan, naratriptan, rizatriptan, sumatriptan, zolmitriptan -certain medicines for sleep -certain medicines that treat or prevent blood clots like dalteparin,  enoxaparin, warfarin -cimetidine -clozapine -diuretics -fentanyl -furazolidone -indinavir -isoniazid -lithium -metoprolol -NSAIDS, medicines for pain and inflammation, like ibuprofen or naproxen -other medicines that prolong the QT interval (cause an abnormal heart rhythm) -procarbazine -rasagiline -supplements like St. John's wort, kava kava, valerian -tramadol -tryptophan This list may not describe all possible interactions. Give your health care provider a list of all the medicines, herbs, non-prescription drugs, or dietary supplements you use. Also tell them if you smoke, drink alcohol, or use illegal drugs. Some items may interact with your medicine. What should I watch for while using this medicine? Tell your doctor if your symptoms do not get better or if they get worse. Visit your doctor or health care professional for regular checks on your progress. Because it may take several weeks to see the full effects of this medicine, it is important to continue your treatment as prescribed by your doctor. Patients and their families should watch out for new or worsening thoughts of suicide or depression. Also watch out for sudden changes in feelings such as feeling anxious, agitated, panicky, irritable, hostile, aggressive, impulsive, severely restless, overly excited and hyperactive, or not being able to sleep. If this happens, especially at the beginning of treatment or after a change in dose, call your health care professional. This medicine can cause an increase in blood pressure. Check with your doctor for instructions on monitoring your blood pressure while taking this medicine. You may get drowsy or dizzy. Do not drive, use machinery, or do anything that needs mental alertness until you know how this medicine affects you. Do not stand or sit up quickly, especially if you are an older patient. This reduces the risk of dizzy or fainting spells. Alcohol may interfere with the effect of this  medicine. Avoid alcoholic drinks. Your mouth may get dry. Chewing sugarless gum, sucking hard candy and drinking plenty of water will help. Contact your doctor if the problem does not go away or is severe. What side effects may I notice from receiving this medicine? Side effects that you should report to your doctor or health care professional as soon as possible: -allergic reactions like skin rash, itching or hives, swelling of the face, lips, or tongue -anxious -breathing problems -confusion -changes in vision -chest pain -confusion -elevated mood, decreased need for sleep, racing thoughts, impulsive behavior -eye pain -fast, irregular heartbeat -feeling faint or lightheaded, falls -feeling agitated, angry, or irritable -hallucination, loss of contact with reality -high blood pressure -loss of balance or coordination -palpitations -redness, blistering, peeling or loosening of the skin, including inside the mouth -restlessness, pacing, inability to keep still -seizures -stiff muscles -suicidal thoughts or other mood changes -trouble passing urine or change in the amount of urine -trouble sleeping -unusual bleeding or bruising -unusually weak or tired -vomiting Side effects that usually do not require medical attention (report to your doctor or health care professional if they continue or are bothersome): -change in sex drive or performance -change in appetite or weight -constipation -dizziness -dry mouth -headache -increased sweating -nausea -tired This list may not describe all possible side effects. Call your doctor for medical advice about side effects. You may report side effects to FDA at 1-800-FDA-1088. Where should I keep my medicine? Keep out of the reach of children. Store  at a controlled temperature between 20 and 25 degrees C (68 degrees and 77 degrees F), in a dry place. Throw away any unused medicine after the expiration date. NOTE: This sheet is a summary.  It may not cover all possible information. If you have questions about this medicine, talk to your doctor, pharmacist, or health care provider.  2019 Elsevier/Gold Standard (2016-02-21 18:38:02)

## 2019-01-03 NOTE — Progress Notes (Signed)
PATIENT: Candice Oconnor DOB: [birth year]  REASON FOR VISIT: follow up HISTORY FROM: patient  Virtual Visit via Telephone Note  I connected with Candice Oconnor on 01/03/19 at 11:30 AM EDT by telephone and verified that I am speaking with the correct person using two identifiers.   I discussed the limitations, risks, security and privacy concerns of performing an evaluation and management service by telephone and the availability of in person appointments. I also discussed with the patient that there may be a patient responsible charge related to this service. The patient expressed understanding and agreed to proceed.   History of Present Illness:  01/03/19 Candice Oconnor is a 48 y.o. female for follow up. She was taking propranolol 40mg  TID until a recent ER visit due to atypical chest pain. 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 She was evaluated by cardiology and advised to stop propranolol. Dr Jaynee Eagles suggested that she decrease to BID dosing until we were able to discuss today. She has titrated to once daily dosing of propranolol. She feels that her headaches are stable at this point but she is very much concerned that they will worsen once she stops propranolol. She feels that migraines are triggered by stress. She has never tries venlafaxine in the past.   She has tried ubrogepant with no relief. She feels that rizatriptan works better. She is continuing to do well with Amovig monthly.     History (copied from Dr Cathren Laine note 10/27/2018)  Interval history 10/27/2018: She returns today for follow up. She is doing great with Amovig. She is having 1-2 migraines a month, usually the week prior to Amovig dose being due. She is using rizatriptan for abortive therapy but is concerned about side effects. She continues propranolol daily. She is tolerating medications well.   Interval history 12/09/2017: Extensive workup and LP normal. MRi brain normal (reviewed images  with patient and answered all questions), opening pressure was 16 on LP. She was started on propranolol and Topiramate ER (qudexy). She is doing well. She is on 40,g propranolol and Quedexy 25mg . She is having side effects to Qudexy. Stop the Quedexy due to side effects.  Since last Thursday feels better on the propranolol. Discussed stopping Topiramate, will add on Ajovy.  HPI:  Candice Oconnor is a 48 y.o. female here as a referral from Dr. Nani Ravens for migraines. PMHx migraine.  Headaches started in her early 79s, were triggered by stress and poor eating. She would have them occasionally. For the past 3 months, headaches have been more frequent and now she has daily headaches. Imitrex doesn;t help. On the 21st, eye pain woke her up in the middle of the night. She had an MRI brain, she saw her eye doctor, she saw ophthalmology and also her primary care. MRI brain was negative. Her migraines are in the frontal area and on the top of her head on left, light sensitivity, nausea, pulsating like a heart beat and sharp pains, constant. A wet cloth and laying in the dark help, they can last 24 hours. She has other headaches, more pressure on the right side of the head, constant dull, she has mild continuous light sensitivity and some mild nausea. Can wax and wane but it has been continuous since the 21st and right eye pain. She has tried imitrex, ibuprofen, she was started on propranolol and is feeling better. Sister has migraines. No medication overuse. She is aware of rebound headaches.  Vision is worsening but she denies diplopia, vision is blurry, she feels like her ears are full, lots of pressure, No other focal neurologic deficits, associated symptoms, inciting events or modifiable factors.  Reviewed notes, labs and imaging from outside physicians, which showed:  Personally reviewed MRI images, normal  Cbc/cmp normal   Observations/Objective:  Generalized: Well developed, in no acute distress   Mentation: Alert oriented to time, place, history taking. Follows all commands speech and language fluent   Assessment and Plan:  48 y.o. year old female  has a past medical history of Chicken pox, HPV in female, Hyperlipidemia, Migraine, and WPW (Wolff-Parkinson-White syndrome). here with    ICD-10-CM   1. Chronic migraine without aura without status migrainosus, not intractable G43.709   2. Wolff-Parkinson-White (WPW) syndrome I45.6    At the recommendation of her cardiologist, we will wean Candice Oconnor off propranolol.  She was advised to start propranolol 40 mg every other day for the next week.  She will then stop this medication.  We will start venlafaxine 37.5 mg today.  We have discussed potential for increasing this to 75 mg if tolerated well.  She was educated on potential side effects and when to seek medical attention.  She will continue Aimovig monthly and rizatriptan as needed for abortive therapy.  I have discontinued Ubrelvy.  We will place propranolol in the allergy list due to side effects associated with WPWS.  She verbalizes understanding and agreement with plan.  She will follow-up in 1 year, sooner if needed.  No orders of the defined types were placed in this encounter.   Meds ordered this encounter  Medications  . venlafaxine XR (EFFEXOR-XR) 37.5 MG 24 hr capsule    Sig: Take 1 capsule (37.5 mg total) by mouth daily with breakfast.    Dispense:  30 capsule    Refill:  11    Order Specific Question:   Supervising Provider    Answer:   Melvenia Beam [[id number]]     Follow Up Instructions:  I discussed the assessment and treatment plan with the patient. The patient was provided an opportunity to ask questions and all were answered. The patient agreed with the plan and demonstrated an understanding of the instructions.   The patient was advised to call back or seek an in-person evaluation if the symptoms worsen or if the condition fails to improve as anticipated.   I provided 30 minutes of non-face-to-face time during this encounter.  Patient is located at her place of residence during visit, provider is in the office.  Hendricks Milo, RN help to facilitate visit.   Debbora Presto, NP

## 2019-01-05 NOTE — Progress Notes (Signed)
Made any corrections needed, and agree with history, physical, neuro exam,assessment and plan as stated.     Antonia Ahern, MD [facility]  

## 2019-01-29 ENCOUNTER — Emergency Department (HOSPITAL_COMMUNITY)
Admission: EM | Admit: 2019-01-29 | Discharge: 2019-01-29 | Disposition: A | Discharge status: Home / Self Care | Payer: 59 | Attending: Emergency Medicine | Admitting: Emergency Medicine

## 2019-01-29 ENCOUNTER — Other Ambulatory Visit: Payer: Self-pay

## 2019-01-29 ENCOUNTER — Encounter (HOSPITAL_COMMUNITY): Payer: Self-pay

## 2019-01-29 ENCOUNTER — Emergency Department (HOSPITAL_COMMUNITY): Payer: 59

## 2019-01-29 DIAGNOSIS — I456 Pre-excitation syndrome: Secondary | ICD-10-CM | POA: Diagnosis not present

## 2019-01-29 DIAGNOSIS — N390 Urinary tract infection, site not specified: Secondary | ICD-10-CM | POA: Diagnosis not present

## 2019-01-29 DIAGNOSIS — R1032 Left lower quadrant pain: Secondary | ICD-10-CM | POA: Diagnosis present

## 2019-01-29 DIAGNOSIS — R110 Nausea: Secondary | ICD-10-CM | POA: Diagnosis not present

## 2019-01-29 DIAGNOSIS — N200 Calculus of kidney: Secondary | ICD-10-CM

## 2019-01-29 LAB — COMPREHENSIVE METABOLIC PANEL
ALT: 21 U/L (ref 0–44)
AST: 25 U/L (ref 15–41)
Albumin: 3.8 g/dL (ref 3.5–5.0)
Alkaline Phosphatase: 62 U/L (ref 38–126)
Anion gap: 13 (ref 5–15)
BUN: 10 mg/dL (ref 6–20)
CO2: 17 mmol/L — ABNORMAL LOW (ref 22–32)
Calcium: 9.3 mg/dL (ref 8.9–10.3)
Chloride: 109 mmol/L (ref 98–111)
Creatinine, Ser: 0.95 mg/dL (ref 0.44–1.00)
GFR calc Af Amer: >60 mL/min (ref >60)
GFR calc non Af Amer: >60 mL/min (ref >60)
Glucose, Bld: 122 mg/dL — ABNORMAL HIGH (ref 70–99)
Potassium: 3.4 mmol/L — ABNORMAL LOW (ref 3.5–5.1)
Sodium: 139 mmol/L (ref 135–145)
Total Bilirubin: 0.4 mg/dL (ref 0.3–1.2)
Total Protein: 7.8 g/dL (ref 6.5–8.1)

## 2019-01-29 LAB — URINALYSIS, ROUTINE W REFLEX MICROSCOPIC
Bilirubin Urine: NEGATIVE
Glucose, UA: NEGATIVE mg/dL
Ketones, ur: 5 mg/dL — AB
Nitrite: POSITIVE — AB
Protein, ur: NEGATIVE mg/dL
Specific Gravity, Urine: 1.006 (ref 1.005–1.030)
pH: 6 (ref 5.0–8.0)

## 2019-01-29 LAB — CBC
HCT: 41.6 % (ref 36.0–46.0)
Hemoglobin: 13.6 g/dL (ref 12.0–15.0)
MCH: 28 pg (ref 26.0–34.0)
MCHC: 32.7 g/dL (ref 30.0–36.0)
MCV: 85.6 fL (ref 80.0–100.0)
Platelets: 319 10*3/uL (ref 150–400)
RBC: 4.86 MIL/uL (ref 3.87–5.11)
RDW: 13.4 % (ref 11.5–15.5)
WBC: 13.2 10*3/uL — ABNORMAL HIGH (ref 4.0–10.5)
nRBC: 0 % (ref 0.0–0.2)

## 2019-01-29 LAB — I-STAT BETA HCG BLOOD, ED (MC, WL, AP ONLY): I-stat hCG, quantitative: <5 m[IU]/mL (ref <5)

## 2019-01-29 LAB — LIPASE, BLOOD: Lipase: 29 U/L (ref 11–51)

## 2019-01-29 MED ORDER — OXYCODONE HCL 5 MG PO TABS: 5.0000 mg | ORAL_TABLET | ORAL | 0 refills | Status: DC | PRN

## 2019-01-29 MED ORDER — CEPHALEXIN 500 MG PO CAPS: 500.0000 mg | ORAL_CAPSULE | Freq: Three times a day (TID) | ORAL | 0 refills | Status: AC

## 2019-01-29 MED ORDER — CEPHALEXIN 250 MG PO CAPS
500.0000 mg | ORAL_CAPSULE | Freq: Once | ORAL | Status: AC
  Administered 2019-01-29: 500 mg via ORAL
  Filled 2019-01-29: qty 2

## 2019-01-29 MED ORDER — HYDROMORPHONE HCL 1 MG/ML IJ SOLN
1.0000 mg | Freq: Once | INTRAMUSCULAR | Status: AC
  Administered 2019-01-29: 1 mg via INTRAVENOUS
  Filled 2019-01-29: qty 1

## 2019-01-29 MED ORDER — SODIUM CHLORIDE 0.9 % IV BOLUS
1000.0000 mL | Freq: Once | INTRAVENOUS | Status: AC
  Administered 2019-01-29: 1000 mL via INTRAVENOUS

## 2019-01-29 NOTE — ED Provider Notes (Signed)
[facility] Emergency Department Provider Note MRN:  [id number]  Arrival date & time: 01/29/19     Chief Complaint   Flank Pain   History of Present Illness   Candice Oconnor is a 48 y.o. year-old female with a history of Wolff-Parkinson-White, migraine, kidney stones presenting to the ED with chief complaint of flank pain.  Sudden onset pain in the right flank that occurred 2 hours prior to arrival.  Pain has since migrated or radiated to the right groin.  Feels very similar to prior kidney stone.  Pain is constant, sharp, patient is unable to find a comfortable position.  Denies fever, endorsing nausea, no vomiting, no chest pain or shortness of breath, no upper abdominal pain, some urinary hesitancy this morning.  Review of Systems  A complete 10 system review of systems was obtained and all systems are negative except as noted in the HPI and PMH.   Patient's Health History    Past Medical History:  Diagnosis Date  . Chicken pox   . HPV in female   . Hyperlipidemia   . Migraine   . WPW (Wolff-Parkinson-White syndrome)     Past Surgical History:  Procedure Laterality Date  . EPIDURAL BLOOD [facility]  11/16/2017  . LUMBAR PUNCTURE  11/12/2017    Family History  Problem Relation Age of Onset  . Heart disease Mother   . Diabetes Mother   . Heart disease Father   . Diabetes Father   . Breast cancer Neg Hx     Social History   Socioeconomic History  . Marital status: Divorced    Spouse name: Not on file  . Number of children: 1  . Years of education: Not on file  . Highest education level: Associate degree: occupational, Hotel manager, or vocational program  Occupational History  . Not on file  Social Needs  . Financial resource strain: Not on file  . Food insecurity:    Worry: Not on file    Inability: Not on file  . Transportation needs:    Medical: Not on file    Non-medical: Not on file  Tobacco Use  . Smoking status: Never Smoker  . Smokeless  tobacco: Never Used  Substance and Sexual Activity  . Alcohol use: Yes    Comment: occ  . 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 Sexual activity: Not Currently    Partners: Male    Birth control/protection: Pill  Lifestyle  . Physical activity:    Days per week: Not on file    Minutes per session: Not on file  . Stress: Not on file  Relationships  . Social connections:    Talks on phone: Not on file    Gets together: Not on file    Attends religious service: Not on file    Active member of club or organization: Not on file    Attends meetings of clubs or organizations: Not on file    Relationship status: Not on file  . Intimate partner violence:    Fear of current or ex partner: Not on file    Emotionally abused: Not on file    Physically abused: Not on file    Forced sexual activity: Not on file  Other Topics Concern  . Not on file  Social History Narrative   Lives at home with her child   Right handed   Drinks 1 cup of caffeine daily     Physical  Exam  Vital Signs and Nursing Notes reviewed Vitals:   01/29/19 0901 01/29/19 0904  BP: (!) 154/70   Pulse:  100  Resp:  12  Temp:    SpO2: 97% 96%    CONSTITUTIONAL: Well-appearing, in moderate distress due to pain NEURO:  Alert and oriented x 3, no focal deficits EYES:  eyes equal and reactive ENT/NECK:  no LAD, no JVD CARDIO: Regular rate, well-perfused, normal S1 and S2 PULM:  CTAB no wheezing or rhonchi GI/GU:  normal bowel sounds, non-distended, non-tender, moderate right CVA tenderness MSK/SPINE:  No gross deformities, no edema SKIN:  no rash, atraumatic PSYCH:  Appropriate speech and behavior  Diagnostic and Interventional Summary    EKG Interpretation  Date/Time:  Saturday January 29 2019 07:49:09 EDT Ventricular Rate:  86 PR Interval:    QRS Duration: 121 QT Interval:  403 QTC Calculation: 482 R Axis:   77 Text Interpretation:  Sinus rhythm Vent pre-excitat'n(WPW), left  acces'y pathway Confirmed by Gerlene Fee [phone]) on 01/29/2019 7:56:55 AM      Labs Reviewed  CBC - Abnormal; Notable for the following components:      Result Value   WBC 13.2 (*)    All other components within normal limits  COMPREHENSIVE METABOLIC PANEL - Abnormal; Notable for the following components:   Potassium 3.4 (*)    CO2 17 (*)    Glucose, Bld 122 (*)    All other components within normal limits  URINALYSIS, ROUTINE W REFLEX MICROSCOPIC - Abnormal; Notable for the following components:   APPearance HAZY (*)    Hgb urine dipstick MODERATE (*)    Ketones, ur 5 (*)    Nitrite POSITIVE (*)    Leukocytes,Ua TRACE (*)    Bacteria, UA RARE (*)    All other components within normal limits  LIPASE, BLOOD  I-STAT BETA HCG BLOOD, ED ([facility], [facility], [facility] ONLY)    CT RENAL STONE STUDY  Final Result      Medications  cephALEXin (KEFLEX) capsule 500 mg (has no administration in time range)  HYDROmorphone (DILAUDID) injection 1 mg (1 mg Intravenous Given 01/29/19 0750)  sodium chloride 0.9 % bolus 1,000 mL (0 mLs Intravenous Stopped 01/29/19 0857)     Procedures Critical Care  ED Course and Medical Decision Making  I have reviewed the triage vital signs and the nursing notes.  Pertinent labs & imaging results that were available during my care of the patient were reviewed by me and considered in my medical decision making (see below for details).  Considering kidney stone versus right-sided diverticulitis versus appendicitis, CT to evaluate.  CT confirms 3 mm stone that has successfully passed into the bladder.  Patient's pain is much improved and labs are reassuring.  Urinalysis shows some evidence of infection.  Given that the stone is already passed into the bladder and there is evidence of resolving hydronephrosis, there is no indication for urology consultation at this time.  Advise close urology follow-up, prescription for Keflex.  Barth Kirks. Sedonia Small, MD [place] [email or web address]  Final Clinical Impressions(s) / ED Diagnoses     ICD-10-CM   1. Lower urinary tract infectious disease N39.0   2. Kidney stone N20.0     ED Discharge Orders         Ordered    cephALEXin (KEFLEX) 500 MG capsule  3 times daily     01/29/19 0932    oxyCODONE (ROXICODONE) 5 MG immediate release tablet  Every 4 hours PRN     01/29/19 0932             Maudie Flakes, MD 01/29/19 [phone]

## 2019-01-29 NOTE — ED Notes (Signed)
GOT PATIENT UNDRESS INTO A GOWN ON THEW MONITOR PATIENT IS RESTING WITH CALL BELL IN REACH

## 2019-01-29 NOTE — ED Triage Notes (Signed)
Pt from home for evaluation of R flank and pelvic pain that began this am; pt endorses difficulty urinating, N/V/D; pt states she took phenergan around 0400

## 2019-01-29 NOTE — Discharge Instructions (Addendum)
You were evaluated in the Emergency Department and after careful evaluation, we did not find any emergent condition requiring admission or further testing in the hospital.  Your symptoms today seem to be due to a kidney stone.  Luckily, it appears that your kidney stone has already passed into the bladder and so the large majority your pain should be over.  Your urine sample does show sign of infection.  Please take the antibiotics as directed.  We recommend straining the urine to save the stone and follow-up with urology.  Please return to the Emergency Department if you experience any worsening of your condition.  We encourage you to follow up with a primary care provider.  Thank you for allowing [place] to be a part of your care.

## 2019-01-29 NOTE — ED Notes (Signed)
Patient transported to CT 

## 2019-01-29 NOTE — ED Notes (Signed)
Patient verbalizes understanding of discharge instructions. Opportunity for questioning and answers were provided. Pt discharged from ED. 

## 2019-01-29 NOTE — ED Notes (Signed)
Pt ambulatory to and from restroom with steady gait 

## 2019-02-02 ENCOUNTER — Telehealth: Payer: 59 | Admitting: Family

## 2019-02-02 DIAGNOSIS — R6889 Other general symptoms and signs: Secondary | ICD-10-CM

## 2019-02-02 DIAGNOSIS — Z20822 Contact with and (suspected) exposure to covid-19: Secondary | ICD-10-CM

## 2019-02-02 IMAGING — MG DIGITAL SCREENING BILATERAL MAMMOGRAM WITH CAD
5 series · 5 of 5 positions shown · non-contrast
Comparison: Previous exam(s).

CLINICAL DATA: Screening.

EXAM:
DIGITAL SCREENING BILATERAL MAMMOGRAM WITH CAD

[R CC (1 of 2)]
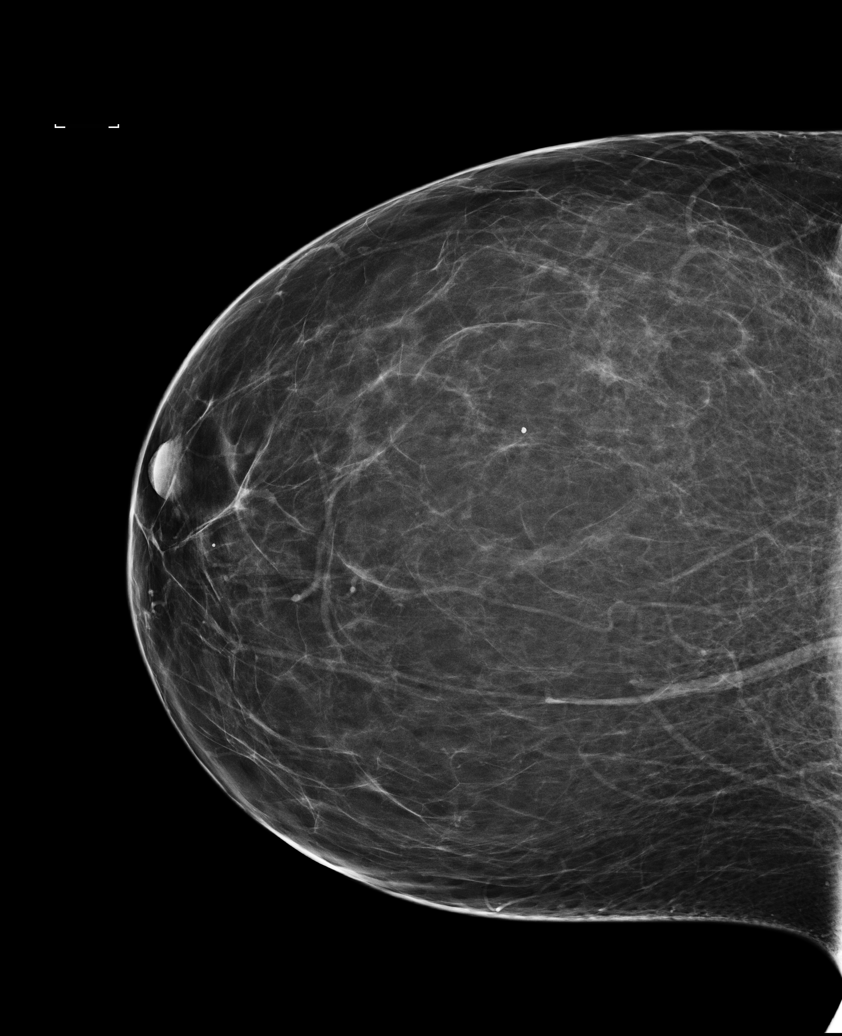

[L MLO]
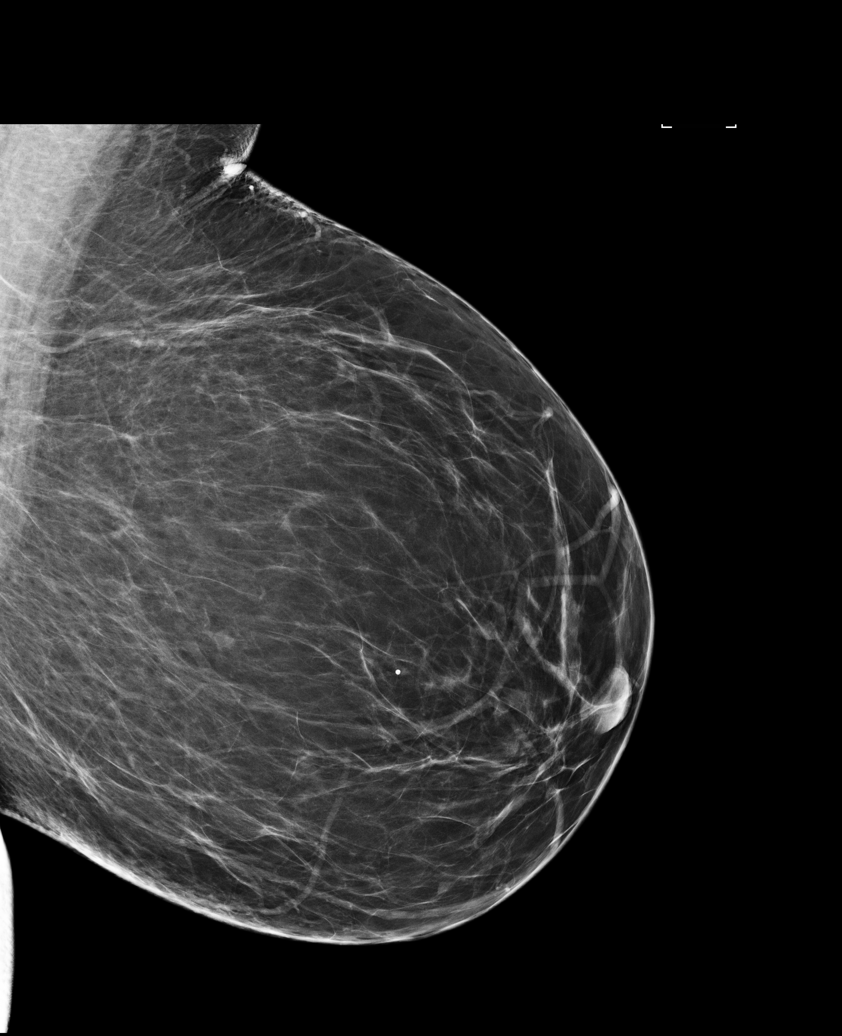

[R CC (2 of 2)]
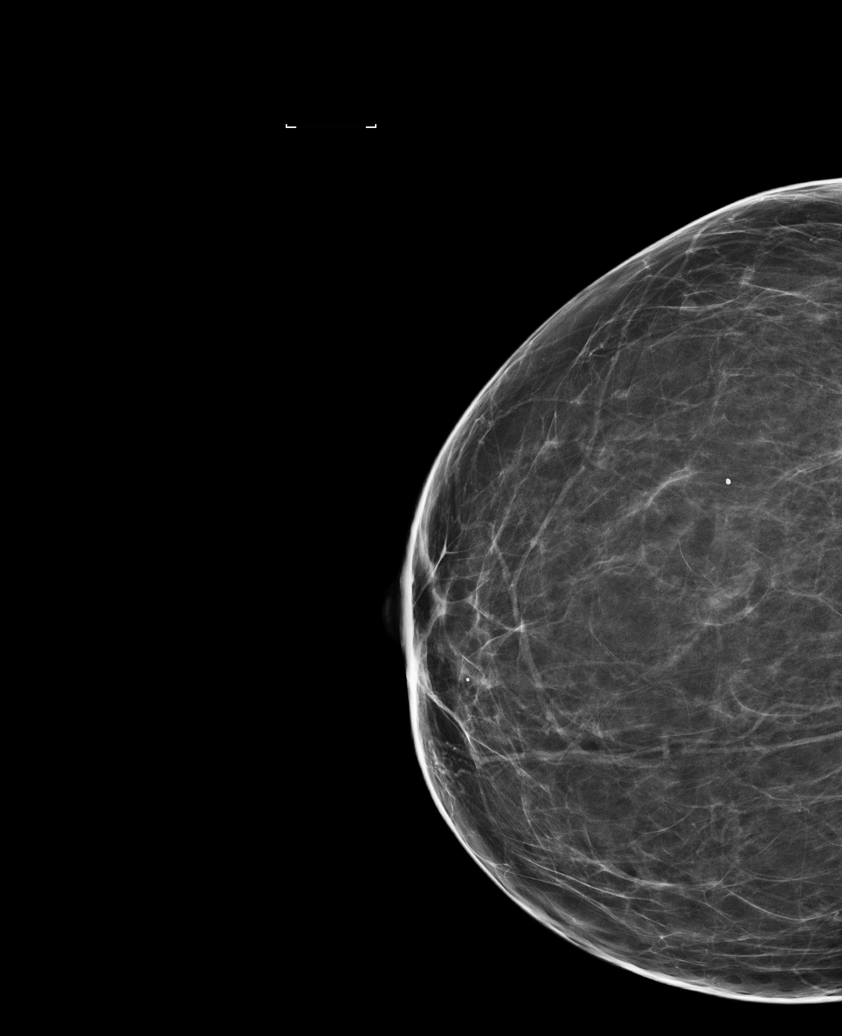

[R MLO]
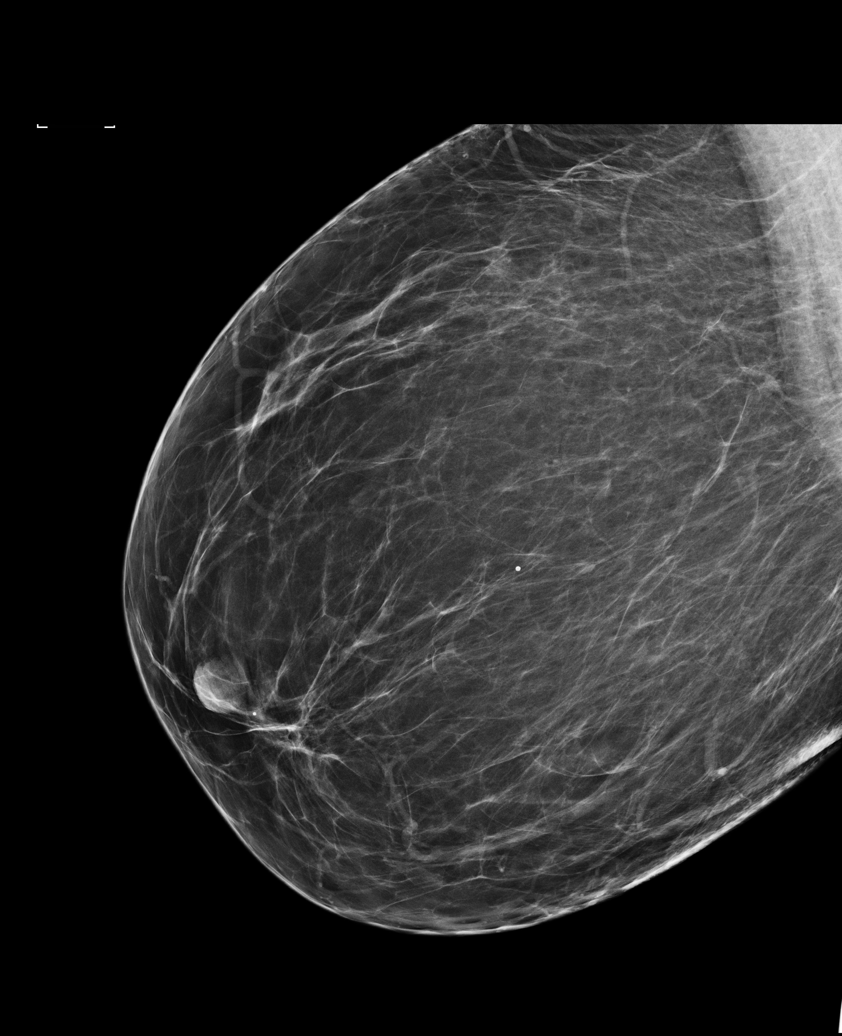

[L CC]
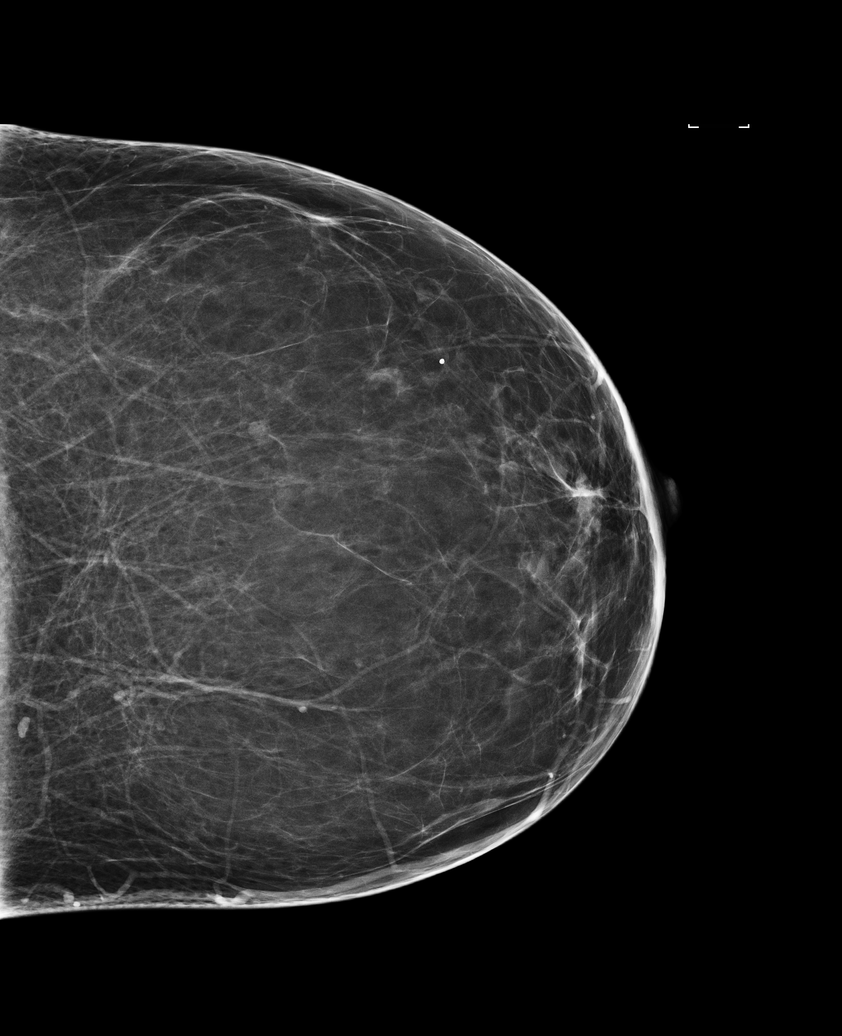

[5 of 5 positions shown; findings below may reference images not displayed]

ACR Breast Density Category b: There are scattered areas of
fibroglandular density.
FINDINGS: There are no findings suspicious for malignancy. Images were
processed with CAD.
IMPRESSION: No mammographic evidence of malignancy. A result letter of this
screening mammogram will be mailed directly to the patient.

RECOMMENDATION:
Screening mammogram in one year. (Code:AS-G-LCT)

BI-RADS CATEGORY  1: Negative.

## 2019-02-02 MED ORDER — BENZONATATE 100 MG PO CAPS: 100.0000 mg | ORAL_CAPSULE | Freq: Three times a day (TID) | ORAL | 0 refills | Status: DC | PRN

## 2019-02-02 MED ORDER — ALBUTEROL SULFATE HFA 108 (90 BASE) MCG/ACT IN AERS: 2.0000 | INHALATION_SPRAY | Freq: Four times a day (QID) | RESPIRATORY_TRACT | 0 refills | Status: AC | PRN

## 2019-02-02 NOTE — Progress Notes (Signed)
E-Visit for Corona Virus Screening  Based on your current symptoms, you may very well have the virus, however your symptoms are mild. Currently, not all patients are being tested. If the symptoms are mild and there is not a known exposure, performing the test is not indicated.  Approximately 5 minutes was spent documenting and reviewing patient's chart.   I am sorry, but we are not testing anyone outpatient at this time. With your symptoms, we are treating as you have the Corona Virus. This is a viral illness and if the test is positive it is not going to change our plan of care. I hope you feel better soon. It is very important that you self-isolate yourself for the next 14 days and avoid all contact with anyone over the age of 61 years or older!!!   Lots of rest, force fluids, wash your hands frequently, and Tylenol or Motrin as needed. Hope you feel better soon! Coronavirus disease 2019 (COVID-19) is a respiratory illness that can spread from person to person. The virus that causes COVID-19 is a new virus that was first identified in the country of [place] but is now found in multiple other countries and has spread to the [place].  Symptoms associated with the virus are mild to severe fever, cough, and shortness of breath. There is currently no vaccine to protect against COVID-19, and there is no specific antiviral treatment for the virus.   To be considered HIGH RISK for Coronavirus (COVID-19), you have to meet the following criteria:  . Traveled to [place], [place], [place], [place] or [place]; or in the [place] to [place], [place], [place], or [place]; and have fever, cough, and shortness of breath within the last 2 weeks of travel OR  . Been in close contact with a person diagnosed with COVID-19 within the last 2 weeks and have fever, cough, and shortness of breath  . IF YOU DO NOT MEET THESE CRITERIA, YOU ARE CONSIDERED LOW RISK FOR COVID-19.   It is vitally important that if  you feel that you have an infection such as this virus or any other virus that you stay home and away from places where you may spread it to others.  You should self-quarantine for 14 days if you have symptoms that could potentially be coronavirus and avoid contact with people age 43 and older.   You can use medication such as A prescription cough medication called Tessalon Perles 100 mg. You may take 1-2 capsules every 8 hours as needed for cough and A prescription inhaler called Albuterol MDI 90 mcg /actuation 2 puffs every 4 hours as needed for shortness of breath, wheezing, cough  You may also take acetaminophen (Tylenol) as needed for fever.   Reduce your risk of any infection by using the same precautions used for avoiding the common cold or flu:  Marland Kitchen Wash your hands often with soap and warm water for at least 20 seconds.  If soap and water are not readily available, use an alcohol-based hand sanitizer with at least 60% alcohol.  . If coughing or sneezing, cover your mouth and nose by coughing or sneezing into the elbow areas of your shirt or coat, into a tissue or into your sleeve (not your hands). . Avoid shaking hands with others and consider head nods or verbal greetings only. . Avoid touching your eyes, nose, or mouth with unwashed hands.  . Avoid close contact with people who are sick. . Avoid places or  events with large numbers of people in one location, like concerts or sporting events. . Carefully consider travel plans you have or are making. . If you are planning any travel outside or inside the [place], visit the CDC's Travelers' Health webpage for the latest health notices. . If you have some symptoms but not all symptoms, continue to monitor at home and seek medical attention if your symptoms worsen. . If you are having a medical emergency, call 911.  HOME CARE . Only take medications as instructed by your medical team. . Drink plenty of fluids and get plenty of rest. . A steam or  ultrasonic humidifier can help if you have congestion.   GET HELP RIGHT AWAY IF: . You develop worsening fever. . You become short of breath . You cough up blood. . Your symptoms become more severe MAKE SURE YOU   Understand these instructions.  Will watch your condition.  Will get help right away if you are not doing well or get worse.  Your e-visit answers were reviewed by a board certified advanced clinical practitioner to complete your personal care plan.  Depending on the condition, your plan could have included both over the counter or prescription medications.  If there is a problem please reply once you have received a response from your provider. Your safety is important to [place].  If you have drug allergies check your prescription carefully.    You can use MyChart to ask questions about today's visit, request a non-urgent call back, or ask for a work or school excuse for 24 hours related to this e-Visit. If it has been greater than 24 hours you will need to follow up with your provider, or enter a new e-Visit to address those concerns. You will get an e-mail in the next two days asking about your experience.  I hope that your e-visit has been valuable and will speed your recovery. Thank you for using e-visits.

## 2019-02-25 ENCOUNTER — Ambulatory Visit (INDEPENDENT_AMBULATORY_CARE_PROVIDER_SITE_OTHER): Payer: 59 | Admitting: Cardiology

## 2019-02-25 ENCOUNTER — Encounter: Payer: Self-pay | Admitting: Cardiology

## 2019-02-25 ENCOUNTER — Other Ambulatory Visit: Payer: Self-pay

## 2019-02-25 VITALS — BP 144/89 | HR 85 | Ht 63.0 in | Wt 240.0 lb

## 2019-02-25 DIAGNOSIS — I456 Pre-excitation syndrome: Secondary | ICD-10-CM

## 2019-02-25 DIAGNOSIS — R072 Precordial pain: Secondary | ICD-10-CM | POA: Diagnosis not present

## 2019-02-25 DIAGNOSIS — Z8249 Family history of ischemic heart disease and other diseases of the circulatory system: Secondary | ICD-10-CM | POA: Diagnosis not present

## 2019-02-25 DIAGNOSIS — J988 Other specified respiratory disorders: Secondary | ICD-10-CM | POA: Diagnosis not present

## 2019-02-25 DIAGNOSIS — U071 COVID-19: Secondary | ICD-10-CM | POA: Insufficient documentation

## 2019-02-25 DIAGNOSIS — Z9189 Other specified personal risk factors, not elsewhere classified: Secondary | ICD-10-CM | POA: Insufficient documentation

## 2019-02-25 NOTE — Progress Notes (Signed)
Patient referred by [facility] for chest pain  Subjective:   Candice Oconnor, female    DOB: 1971/09/20, 48 y.o.   MRN: [id number]   Chief Complaint  Patient presents with  . Precordial pain  . Follow-up    8wk     HPI  48 y.o. Caucasian female with h/o migraines, morbid obesity, family h/o premature CAD, chest pain.  While she has strong family history of premature CAD, I felt her chest pain was noncardiac in nature, likely from musculoskeletal etiology.  Since her last visit with me, she had a urolithiasis and UTI, as well symptoms s/o COVID infection (fever, cough, loss of taste). However, given her low risk status for complications, she was not tested but instead empirically quarantined for two weeks. She has now recovered without any residual symptoms.   She was started on Effexor by her neurologist, which has helped her migraines and her anxiety. Her chest pain symptoms have also resolved.   Past Medical History:  Diagnosis Date  . Chicken pox   . HPV in female   . Hyperlipidemia   . Migraine   . WPW (Wolff-Parkinson-White syndrome)      Past Surgical History:  Procedure Laterality Date  . EPIDURAL BLOOD [facility]  11/16/2017  . LUMBAR PUNCTURE  11/12/2017     Social History   Socioeconomic History  . Marital status: Divorced    Spouse name: Not on file  . Number of children: 1  . Years of education: Not on file  . Highest education level: Associate degree: occupational, Hotel manager, or vocational program  Occupational History  . Not on file  Social Needs  . Financial resource strain: Not on file  . Food insecurity:    Worry: Not on file    Inability: Not on file  . Transportation needs:    Medical: Not on file    Non-medical: Not on file  Tobacco Use  . Smoking status: Never Smoker  . Smokeless tobacco: Never Used  Substance and Sexual Activity  . Alcohol use: Yes    Comment: occ  . Drug use: No    Comment: previous addiction to  Benzodiazepines   . Sexual activity: Not Currently    Partners: Male    Birth control/protection: Pill  Lifestyle  . Physical activity:    Days per week: Not on file    Minutes per session: Not on file  . Stress: Not on file  Relationships  . Social connections:    Talks on phone: Not on file    Gets together: Not on file    Attends religious service: Not on file    Active member of club or organization: Not on file    Attends meetings of clubs or organizations: Not on file    Relationship status: Not on file  . Intimate partner violence:    Fear of current or ex partner: Not on file    Emotionally abused: Not on file    Physically abused: Not on file    Forced sexual activity: Not on file  Other Topics Concern  . Not on file  Social History Narrative   Lives at home with her child   Right handed   Drinks 1 cup of caffeine daily     Current Outpatient Medications on File Prior to Visit  Medication Sig Dispense Refill  . albuterol (VENTOLIN HFA) 108 (90 Base) MCG/ACT inhaler Inhale 2 puffs into the lungs every 6 (six) hours as needed  for wheezing or shortness of breath. 1 Inhaler 0  . azelastine (ASTELIN) 0.1 % nasal spray Place 2 sprays into both nostrils 2 (two) times daily as needed (for seasonal allergies).  30 mL 12  . benzonatate (TESSALON PERLES) 100 MG capsule Take 1 capsule (100 mg total) by mouth 3 (three) times daily as needed. 20 capsule 0  . CAMILA 0.35 MG tablet Take 1 tablet by mouth See admin instructions. Take 1 tablet by mouth once a day as directed  4  . Erenumab-aooe (AIMOVIG, 140 MG DOSE,) 70 MG/ML SOAJ Inject 140 mg into the skin every 30 (thirty) days. 2 pen 11  . ibuprofen (ADVIL,MOTRIN) 200 MG tablet Take 200-400 mg by mouth every 6 (six) hours as needed (FOR PAIN).    Marland Kitchen montelukast (SINGULAIR) 10 MG tablet Take 10 mg by mouth at bedtime as needed (for seasonal "flares").     . ondansetron (ZOFRAN ODT) 4 MG disintegrating tablet Take 1 tablet (4 mg  total) by mouth every 8 (eight) hours as needed for nausea or vomiting. 6 tablet 0  . oxyCODONE (ROXICODONE) 5 MG immediate release tablet Take 1 tablet (5 mg total) by mouth every 4 (four) hours as needed for severe pain. 5 tablet 0  . pramipexole (MIRAPEX) 0.5 MG tablet Take 0.5 mg by mouth as needed.    . rizatriptan (MAXALT-MLT) 10 MG disintegrating tablet TAKE 1 TABLET AS NEEDED FOR MIGRAINE. MAY REPEAT IN 2HRS IF NEEDED (Patient taking differently: Take 10 mg by mouth as needed for migraine (AND MAY REPEAT ONCE IN 2 HOURS, IF NO RELIEF). ) 10 tablet 11  . venlafaxine XR (EFFEXOR-XR) 37.5 MG 24 hr capsule Take 1 capsule (37.5 mg total) by mouth daily with breakfast. 30 capsule 11   No current facility-administered medications on file prior to visit.     Cardiovascular studies:  EKG 12/29/2018: Sinus rhythm  WPW pattern.  No change compared to previous EKG 12/27/2018  Recent labs: Performed through her work.  Not available to me.  This includes lipid panel.  Review of Systems  Constitution: Negative for decreased appetite, malaise/fatigue, weight gain and weight loss.  HENT: Negative for congestion.   Eyes: Negative for visual disturbance.  Cardiovascular: Positive for chest pain. Negative for dyspnea on exertion, leg swelling, palpitations and syncope.  Respiratory: Negative for shortness of breath.   Endocrine: Negative for cold intolerance.  Hematologic/Lymphatic: Does not bruise/bleed easily.  Skin: Negative for itching and rash.  Musculoskeletal: Negative for myalgias.  Gastrointestinal: Negative for abdominal pain, nausea and vomiting.  Genitourinary: Negative for dysuria.  Neurological: Positive for headaches (Migraine). Negative for dizziness and weakness.  Psychiatric/Behavioral: The patient is nervous/anxious.   All other systems reviewed and are negative.        Vitals:   02/25/19 1026  BP: (!) 144/89  Pulse: 85    Objective:   Physical Exam   Constitutional: She is oriented to person, place, and time. She appears well-developed and well-nourished. No distress.  HENT:  Head: Normocephalic and atraumatic.  Eyes: Pupils are equal, round, and reactive to light. Conjunctivae are normal.  Neck: No JVD present.  Cardiovascular: Normal rate, regular rhythm and intact distal pulses.  No murmur heard. Pulmonary/Chest: Effort normal and breath sounds normal. She has no wheezes. She has no rales.  Chest wall tenderness  Abdominal: Soft. Bowel sounds are normal. There is no rebound.  Musculoskeletal:        General: No edema.  Lymphadenopathy:    She has  no cervical adenopathy.  Neurological: She is alert and oriented to person, place, and time. No cranial nerve deficit.  Skin: Skin is warm and dry.  Psychiatric: She has a normal mood and affect.  Nursing note and vitals reviewed.         Assessment & Recommendations:   48 y.o. Caucasian female with h/o migraines, morbid obesity, family h/o premature CAD, chest pain, recent COVID infection-now resolved  1. Precordial pain Now resolved. Likely musculoskeletal etiology. However, given her family h/o CAD, I recommend calcium score testing for risk stratification.  2. Wolff-Parkinson-White (WPW) pattern seen on electrocardiography Incidental finding.  Resting heart rate 70.  I suspect her accessory pathway may not be robust enough to conduct at faster rates, given absence of any palpitations, presyncope or syncope symptoms.  This does not need any treatment.  I will see her on as needed basis.   Nigel Mormon, MD [facility] Pager: [phone] Office: [phone] If no answer Cell [phone]

## 2019-04-24 ENCOUNTER — Other Ambulatory Visit: Payer: Self-pay | Admitting: Neurology

## 2019-04-24 DIAGNOSIS — R519 Headache, unspecified: Secondary | ICD-10-CM

## 2019-04-25 ENCOUNTER — Telehealth: Payer: Self-pay | Admitting: Cardiology

## 2019-04-25 NOTE — Telephone Encounter (Signed)
Lvm for pt to call back. 

## 2019-04-25 NOTE — Telephone Encounter (Signed)
Calcium score 0.  Please let the patient know that this is reassuring news that she doe snot have any calcium build up in her coronary arteries.  Thanks MJP

## 2019-04-25 NOTE — Telephone Encounter (Signed)
S/w patient advised her of normal test

## 2019-06-15 ENCOUNTER — Ambulatory Visit
Admission: RE | Admit: 2019-06-15 | Discharge: 2019-06-15 | Disposition: A | Discharge status: Home / Self Care | Payer: 59 | Source: Ambulatory Visit | Attending: Family Medicine | Admitting: Family Medicine

## 2019-06-15 ENCOUNTER — Other Ambulatory Visit: Payer: Self-pay

## 2019-06-15 ENCOUNTER — Other Ambulatory Visit: Payer: Self-pay | Admitting: Family Medicine

## 2019-06-15 ENCOUNTER — Ambulatory Visit: Payer: 59

## 2019-06-15 DIAGNOSIS — Z1231 Encounter for screening mammogram for malignant neoplasm of breast: Secondary | ICD-10-CM

## 2019-06-16 ENCOUNTER — Other Ambulatory Visit: Payer: Self-pay | Admitting: Family Medicine

## 2019-06-16 DIAGNOSIS — R928 Other abnormal and inconclusive findings on diagnostic imaging of breast: Secondary | ICD-10-CM

## 2019-06-22 ENCOUNTER — Other Ambulatory Visit: Payer: Self-pay

## 2019-06-22 ENCOUNTER — Ambulatory Visit
Admission: RE | Admit: 2019-06-22 | Discharge: 2019-06-22 | Disposition: A | Discharge status: Home / Self Care | Payer: 59 | Source: Ambulatory Visit | Attending: Family Medicine | Admitting: Family Medicine

## 2019-06-22 DIAGNOSIS — R928 Other abnormal and inconclusive findings on diagnostic imaging of breast: Secondary | ICD-10-CM

## 2019-06-23 ENCOUNTER — Other Ambulatory Visit: Payer: Self-pay | Admitting: Family

## 2019-06-23 DIAGNOSIS — Z20822 Contact with and (suspected) exposure to covid-19: Secondary | ICD-10-CM

## 2019-06-30 ENCOUNTER — Other Ambulatory Visit: Payer: Self-pay | Admitting: Family Medicine

## 2019-06-30 ENCOUNTER — Other Ambulatory Visit (HOSPITAL_COMMUNITY)
Admission: RE | Admit: 2019-06-30 | Discharge: 2019-06-30 | Disposition: A | Discharge status: Home / Self Care | Payer: 59 | Source: Ambulatory Visit | Attending: Family Medicine | Admitting: Family Medicine

## 2019-06-30 DIAGNOSIS — Z124 Encounter for screening for malignant neoplasm of cervix: Secondary | ICD-10-CM | POA: Insufficient documentation

## 2019-07-02 LAB — CERVICOVAGINAL ANCILLARY ONLY
Chlamydia: NEGATIVE
Neisseria Gonorrhea: NEGATIVE
Trichomonas: NEGATIVE

## 2019-07-12 LAB — CYTOLOGY - PAP
Diagnosis: NEGATIVE
High risk HPV: NEGATIVE

## 2019-07-27 ENCOUNTER — Telehealth: Payer: Self-pay | Admitting: *Deleted

## 2019-07-27 ENCOUNTER — Encounter: Payer: Self-pay | Admitting: *Deleted

## 2019-07-27 NOTE — Telephone Encounter (Signed)
Completed Aimovig PA on CMM. [id number]. Awaiting [facility] determination.

## 2019-07-27 NOTE — Telephone Encounter (Signed)
Received approval letter. I faxed it to pt's pharmacy. Received a receipt of confirmation.

## 2019-07-27 NOTE — Telephone Encounter (Signed)
Per CMM, Request Reference Number: [id number]. AIMOVIG INJ 140MG /ML is approved through 07/26/2020. For further questions, call [phone].   Fax to follow.

## 2019-11-02 ENCOUNTER — Other Ambulatory Visit: Payer: Self-pay

## 2019-11-02 ENCOUNTER — Encounter: Payer: Self-pay | Admitting: Neurology

## 2019-11-02 ENCOUNTER — Ambulatory Visit (INDEPENDENT_AMBULATORY_CARE_PROVIDER_SITE_OTHER): Payer: 59 | Admitting: Neurology

## 2019-11-02 VITALS — BP 124/85 | HR 87 | Temp 97.3°F | Ht 63.0 in | Wt 239.0 lb

## 2019-11-02 DIAGNOSIS — R519 Headache, unspecified: Secondary | ICD-10-CM | POA: Diagnosis not present

## 2019-11-02 DIAGNOSIS — G43709 Chronic migraine without aura, not intractable, without status migrainosus: Secondary | ICD-10-CM

## 2019-11-02 MED ORDER — AIMOVIG (140 MG DOSE) 70 MG/ML ~~LOC~~ SOAJ: 140.0000 mg | SUBCUTANEOUS | 11 refills | Status: DC

## 2019-11-02 MED ORDER — RIZATRIPTAN BENZOATE 10 MG PO TBDP: ORAL_TABLET | ORAL | 11 refills | Status: DC

## 2019-11-02 MED ORDER — VENLAFAXINE HCL ER 75 MG PO CP24: 75.0000 mg | ORAL_CAPSULE | Freq: Every day | ORAL | 4 refills | Status: DC

## 2019-11-02 NOTE — Progress Notes (Signed)
[facility]    Provider:  Dr Jaynee Eagles Primary Care Physician:  Lucianne Lei, MD  CC:  Migraines  Interval history 11/02/2019: Patient Candice Oconnor for follow up of migraine.  We have tried multiple medications with patient such as propranolol, Effexor and others in the past and Candice Oconnor is doing extremely well currently on Aimovig.  Candice Oconnor was late picking up the Aimovig and had a migraine otherwise doing very well.  Candice Oconnor is tried [place] in the past, did not help, Maxalt does work acutely. Candice Oconnor was recently diagnosed with WPW and pre-diabetes. Candice Oconnor has had more depression and stress over the last year, we discussed increasing Candice Oconnor effexor but I also recommend seeing a therapist and a psychiatrist.  Interval history 10/27/2018: Candice Oconnor returns today for follow up. Candice Oconnor is doing great with Amovig. Candice Oconnor is having 1-2 migraines a month, usually the week prior to Amovig dose being due. Candice Oconnor is using rizatriptan for abortive therapy but is concerned about side effects. Candice Oconnor continues propranolol daily. Candice Oconnor is tolerating medications well.   Interval history 12/09/2017: Extensive workup and LP normal. MRi brain normal (reviewed images with patient and answered all questions), opening pressure was 16 on LP. Candice Oconnor was started on propranolol and Topiramate ER (qudexy). Candice Oconnor is doing well. Candice Oconnor is on 40,g propranolol and Quedexy 25mg . Candice Oconnor is having side effects to Qudexy. Stop the Quedexy due to side effects.  Since last Thursday feels better on the propranolol. Discussed stopping Topiramate, will add on Ajovy.  HPI:  Candice Oconnor is a 49 y.o. female here as a referral from Dr. No ref. provider found for migraines. PMHx migraine.  Headaches started in Candice Oconnor early 14s, were triggered by stress and poor eating. Candice Oconnor would have them occasionally. For the past 3 months, headaches have been more frequent and now Candice Oconnor has daily headaches. Imitrex doesn;t help. On the 21st, eye pain woke Candice Oconnor up in the middle of the night. Candice Oconnor had an MRI  brain, Candice Oconnor saw Candice Oconnor eye doctor, Candice Oconnor saw ophthalmology and also Candice Oconnor primary care. MRI brain was negative. Candice Oconnor migraines are in the frontal area and on the top of Candice Oconnor head on left, light sensitivity, nausea, pulsating like a heart beat and sharp pains, constant. A wet cloth and laying in the dark help, they can last 24 hours. Candice Oconnor has other headaches, more pressure on the right side of the head, constant dull, Candice Oconnor has mild continuous light sensitivity and some mild nausea. Can wax and wane but it has been continuous since the 21st and right eye pain. Candice Oconnor has tried imitrex, ibuprofen, Candice Oconnor was started on propranolol and is feeling better. Sister has migraines. No medication overuse. Candice Oconnor is aware of rebound headaches. Vision is worsening but Candice Oconnor denies diplopia, vision is blurry, Candice Oconnor feels like Candice Oconnor ears are full, lots of pressure, No other focal neurologic deficits, associated symptoms, inciting events or modifiable factors.  Reviewed notes, labs and imaging from outside physicians, which showed:  Personally reviewed MRI images, normal  Cbc/cmp normal  Review of Systems: Patient complains of symptoms per HPI as well as the following symptoms: confusion, headache, insomnia, restless legs. . Pertinent negatives and positives per HPI. All others negative.   Social History   Socioeconomic History  . Marital status: Divorced    Spouse name: Not on file  . Number of children: 1  . Years of education: Not on file  . Highest education level: Associate degree: occupational, Hotel manager, or vocational program  Occupational History  . Not  on file  Tobacco Use  . Smoking status: Never Smoker  . Smokeless tobacco: Never Used  Substance and Sexual Activity  . Alcohol use: Yes    Comment: occ  . 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 Sexual activity: Not Currently    Partners: Male    Birth control/protection: Abstinence  Other Topics Concern  . Not on file  Social History  Narrative   Lives at home with Candice Oconnor child   Right handed   Drinks 1 cup of caffeine daily   Social Determinants of Health   Financial Resource Strain:   . Difficulty of Paying Living Expenses: Not on file  Food Insecurity:   . Worried About Charity fundraiser in the Last Year: Not on file  . Ran Out of Food in the Last Year: Not on file  Transportation Needs:   . Lack of Transportation (Medical): Not on file  . Lack of Transportation (Non-Medical): Not on file  Physical Activity:   . Days of Exercise per Week: Not on file  . Minutes of Exercise per Session: Not on file  Stress:   . Feeling of Stress : Not on file  Social Connections:   . Frequency of Communication with Friends and Family: Not on file  . Frequency of Social Gatherings with Friends and Family: Not on file  . Attends Religious Services: Not on file  . Active Member of Clubs or Organizations: Not on file  . Attends Archivist Meetings: Not on file  . Marital Status: Not on file  Intimate Partner Violence:   . Fear of Current or Ex-Partner: Not on file  . Emotionally Abused: Not on file  . Physically Abused: Not on file  . Sexually Abused: Not on file    Family History  Problem Relation Age of Onset  . Heart disease Mother   . Diabetes Mother   . Heart disease Father   . Diabetes Father   . Breast cancer Neg Hx     Past Medical History:  Diagnosis Date  . Chicken pox   . COVID-19   . HPV in female   . Hyperlipidemia   . Migraine   . Prediabetes   . WPW (Wolff-Parkinson-White syndrome)     Past Surgical History:  Procedure Laterality Date  . EPIDURAL BLOOD [facility]  11/16/2017  . LUMBAR PUNCTURE  11/12/2017    Current Outpatient Medications  Medication Sig Dispense Refill  . albuterol (VENTOLIN HFA) 108 (90 Base) MCG/ACT inhaler Inhale 2 puffs into the lungs every 6 (six) hours as needed for wheezing or shortness of breath. 1 Inhaler 0  . Erenumab-aooe (AIMOVIG, 140 MG DOSE,) 70 MG/ML  SOAJ Inject 140 mg into the skin every 30 (thirty) days. 1 pen 11  . montelukast (SINGULAIR) 10 MG tablet Take 10 mg by mouth at bedtime as needed (for seasonal "flares").     . pramipexole (MIRAPEX) 0.5 MG tablet Take 0.5 mg by mouth as needed.    . rizatriptan (MAXALT-MLT) 10 MG disintegrating tablet TAKE 1 TABLET AS NEEDED FOR MIGRAINE, MAY REPEAT IN 2 HOURS IF NEEDED 10 tablet 11  . venlafaxine XR (EFFEXOR XR) 75 MG 24 hr capsule Take 1 capsule (75 mg total) by mouth daily with breakfast. 90 capsule 4   No current facility-administered medications for this visit.    Allergies as of 11/02/2019 - Review Complete 11/02/2019  Allergen Reaction Noted  . [place] Anaphylaxis and Swelling 12/27/2018  .  Propranolol  01/03/2019  . Tetanus toxoid Nausea And Vomiting and Other (See Comments) 12/16/2017  . Tetanus toxoids Nausea And Vomiting and Other (See Comments) 12/16/2017    Vitals: BP 124/85 (BP Location: Right Arm, Patient Position: Sitting)   Pulse 87   Temp (!) 97.3 F (36.3 C)   Ht 5\' 3"  (1.6 m)   Wt 239 lb (108.4 kg)   BMI 42.34 kg/m  Last Weight:  Wt Readings from Last 1 Encounters:  11/02/19 239 lb (108.4 kg)   Last Height:   Ht Readings from Last 1 Encounters:  11/02/19 5\' 3"  (1.6 m)    Physical exam: Exam: Gen: NAD, conversant, well nourised, obese, well groomed                     CV: RRR, no MRG. No Carotid Bruits. No peripheral edema, warm, nontender Eyes: Conjunctivae clear without exudates or hemorrhage  Neuro: Detailed Neurologic Exam  Speech:    Speech is normal; fluent and spontaneous with normal comprehension.  Cognition:    The patient is oriented to person, place, and time;     recent and remote memory intact;     language fluent;     normal attention, concentration,     fund of knowledge Cranial Nerves:    The pupils are equal, round, and reactive to light. The fundi are normal and spontaneous venous pulsations are present. Visual fields are full  to finger confrontation. Extraocular movements are intact. Trigeminal sensation is intact and the muscles of mastication are normal. The face is symmetric. Hearing intact. Voice is normal. Shoulder shrug is normal.   Gait:     gait normal.   Motor Observation:    No asymmetry, no atrophy, and no involuntary movements noted.  Tone:    Normal muscle tone.    Posture:    Posture is normal. normal erect    Strength:    Strength is V/V in the upper and lower limbs.      Sensation: intact to LT       Assessment/Plan:  49 year old with chronic migraines doing exceptionally well on Aimovig, daily, Candice Oconnor has had more depression and stress over the last year, we discussed increasing Candice Oconnor effexor but I also recommend seeing a therapist and a psychiatrist.  Doing excellent on Aimovig, continue  Extensive workup and LP normal. MRi brain normal (reviewed images with patient and answered all questions), opening pressure was 16 on LP. Candice Oconnor was started on propranolol and Topiramate ER (qudexy) had side effects to both, now on effexor as well.   Also need to consider Sleep Apnea which can cause worsening, negative intractable headaches however ESS was <10  [facility], highly recommend  - Rizatriptan helping acutely , ubrogepant did not help. Follow up in 1 year.   Meds ordered this encounter  Medications  . venlafaxine XR (EFFEXOR XR) 75 MG 24 hr capsule    Sig: Take 1 capsule (75 mg total) by mouth daily with breakfast.    Dispense:  90 capsule    Refill:  4  . rizatriptan (MAXALT-MLT) 10 MG disintegrating tablet    Sig: TAKE 1 TABLET AS NEEDED FOR MIGRAINE, MAY REPEAT IN 2 HOURS IF NEEDED    Dispense:  10 tablet    Refill:  11  . Erenumab-aooe (AIMOVIG, 140 MG DOSE,) 70 MG/ML SOAJ    Sig: Inject 140 mg into the skin every 30 (thirty)  days.    Dispense:  1 pen    Refill:  11      Discussed: To prevent or relieve headaches, try the  following: Cool Compress. Lie down and place a cool compress on your head.  Avoid headache triggers. If certain foods or odors seem to have triggered your migraines in the past, avoid them. A headache diary might help you identify triggers.  Include physical activity in your daily routine. Try a daily walk or other moderate aerobic exercise.  Manage stress. Find healthy ways to cope with the stressors, such as delegating tasks on your to-do list.  Practice relaxation techniques. Try deep breathing, yoga, massage and visualization.  Eat regularly. Eating regularly scheduled meals and maintaining a healthy diet might help prevent headaches. Also, drink plenty of fluids.  Follow a regular sleep schedule. Sleep deprivation might contribute to headaches Consider biofeedback. With this mind-body technique, you learn to control certain bodily functions -- such as muscle tension, heart rate and blood pressure -- to prevent headaches or reduce headache pain.    Proceed to emergency room if you experience new or worsening symptoms or symptoms do not resolve, if you have new neurologic symptoms or if headache is severe, or for any concerning symptom.   Provided education and documentation from American headache Society toolbox including articles on: chronic migraine medication overuse headache, chronic migraines, prevention of migraines, behavioral and other nonpharmacologic treatments for headache.  A total of 25 minutes was spent on this patient's care, reviewing imaging, past records, recent hospitalization notes and results. Over half this time was spent on counseling patient on the  1. Chronic migraine without aura without status migrainosus, not intractable   2. Chronic daily headache    diagnosis and different diagnostic and therapeutic options, counseling and coordination of care, risks and benefitsof management, compliance, or risk factor reduction and education.      Sarina Ill,  MD  [facility] [address] [place], [place] [address]  Phone [phone] Fax [phone]

## 2020-04-10 ENCOUNTER — Other Ambulatory Visit: Payer: Self-pay | Admitting: Family

## 2020-04-10 DIAGNOSIS — Z20822 Contact with and (suspected) exposure to covid-19: Secondary | ICD-10-CM

## 2020-06-27 NOTE — Telephone Encounter (Signed)
Completed Aimovig PA for continuation on Cover My Meds. Key: [id number]. Awaiting determination from [facility].

## 2020-06-28 ENCOUNTER — Telehealth: Payer: Self-pay | Admitting: *Deleted

## 2020-06-28 NOTE — Telephone Encounter (Signed)
There was a PA case started by our office for Aimovig 140mg  on covermymeds.  This was the determination:  Key: [id number] - PA Case ID: [id number]  Approved through 06/27/2021.

## 2020-09-03 ENCOUNTER — Telehealth: Payer: Self-pay | Admitting: Neurology

## 2020-09-03 NOTE — Telephone Encounter (Signed)
Pt called, received letter from my insurance, will no longer cover Erenumab-aooe (AIMOVIG, 140 MG DOSE,) 33 MG/ML SOAJ. My insurance will cover Ajovy or Emgality. Which one can you switch me to. Would like a call from the nurse.

## 2020-09-03 NOTE — Telephone Encounter (Signed)
Spoke to pt's ins, they verified that pt's medication is still covered through 06/27/2021 . They believed pt received an old letter.  Called and LVM to pt to explain, pt may call back if she has further questions.

## 2020-10-30 ENCOUNTER — Telehealth: Payer: Self-pay | Admitting: *Deleted

## 2020-10-30 NOTE — Telephone Encounter (Signed)
Received another PA for Aimovig. [facility] no longer prefers Aimovig. Patient has an appt on 11/01/20. Will see if it is appropriate to change to another medication?

## 2020-10-30 NOTE — Telephone Encounter (Signed)
Absolutely. I will be happy to help her. TY.

## 2020-10-31 NOTE — Patient Instructions (Signed)
Below is our plan:  We will switch Amovig to Ajovy. Continue venlafaxine. Continue rizatriptan for abortive therapy.   Please make sure you are staying well hydrated. I recommend 50-60 ounces daily. Well balanced diet and regular exercise encouraged.   Please continue follow up with care team as directed.   Follow up in 1 year   You may receive a survey regarding today's visit. I encourage you to leave honest feed back as I do use this information to improve patient care. Thank you for seeing me today!    Fremanezumab injection What is this medicine? FREMANEZUMAB (fre ma NEZ ue mab) is used to prevent migraine headaches. This medicine may be used for other purposes; ask your health care provider or pharmacist if you have questions. COMMON BRAND NAME(S): AJOVY What should I tell my health care provider before I take this medicine? They need to know if you have any of these conditions:  an unusual or allergic reaction to fremanezumab, other medicines, foods, dyes, or preservatives  pregnant or trying to get pregnant  breast-feeding How should I use this medicine? This medicine is for injection under the skin. You will be taught how to prepare and give this medicine. Use exactly as directed. Take your medicine at regular intervals. Do not take your medicine more often than directed. It is important that you put your used needles and syringes in a special sharps container. Do not put them in a trash can. If you do not have a sharps container, call your pharmacist or healthcare provider to get one. Talk to your pediatrician regarding the use of this medicine in children. Special care may be needed. Overdosage: If you think you have taken too much of this medicine contact a poison control center or emergency room at once. NOTE: This medicine is only for you. Do not share this medicine with others. What if I miss a dose? If you miss a dose, take it as soon as you can. If it is almost time  for your next dose, take only that dose. Do not take double or extra doses. What may interact with this medicine? Interactions are not expected. This list may not describe all possible interactions. Give your health care provider a list of all the medicines, herbs, non-prescription drugs, or dietary supplements you use. Also tell them if you smoke, drink alcohol, or use illegal drugs. Some items may interact with your medicine. What should I watch for while using this medicine? Tell your doctor or healthcare professional if your symptoms do not start to get better or if they get worse. What side effects may I notice from receiving this medicine? Side effects that you should report to your doctor or health care professional as soon as possible:  allergic reactions like skin rash, itching or hives, swelling of the face, lips, or tongue Side effects that usually do not require medical attention (report these to your doctor or health care professional if they continue or are bothersome):  pain, redness, or irritation at site where injected This list may not describe all possible side effects. Call your doctor for medical advice about side effects. You may report side effects to FDA at 1-800-FDA-1088. Where should I keep my medicine? Keep out of the reach of children. You will be instructed on how to store this medicine. Throw away any unused medicine after the expiration date on the label. NOTE: This sheet is a summary. It may not cover all possible information. If you have questions  about this medicine, talk to your doctor, pharmacist, or health care provider.  2021 Elsevier/Gold Standard (2017-06-22 17:22:56)   Migraine Headache A migraine headache is a very strong throbbing pain on one side or both sides of your head. This type of headache can also cause other symptoms. It can last from 4 hours to 3 days. Talk with your doctor about what things may bring on (trigger) this condition. What are  the causes? The exact cause of this condition is not known. This condition may be triggered or caused by:  Drinking alcohol.  Smoking.  Taking medicines, such as: ? Medicine used to treat chest pain (nitroglycerin). ? Birth control pills. ? Estrogen. ? Some blood pressure medicines.  Eating or drinking certain products.  Doing physical activity. Other things that may trigger a migraine headache include:  Having a menstrual period.  Pregnancy.  Hunger.  Stress.  Not getting enough sleep or getting too much sleep.  Weather changes.  Tiredness (fatigue). What increases the risk?  Being 8-71 years old.  Being female.  Having a family history of migraine headaches.  Being Caucasian.  Having depression or anxiety.  Being very overweight. What are the signs or symptoms?  A throbbing pain. This pain may: ? Happen in any area of the head, such as on one side or both sides. ? Make it hard to do daily activities. ? Get worse with physical activity. ? Get worse around bright lights or loud noises.  Other symptoms may include: ? Feeling sick to your stomach (nauseous). ? Vomiting. ? Dizziness. ? Being sensitive to bright lights, loud noises, or smells.  Before you get a migraine headache, you may get warning signs (an aura). An aura may include: ? Seeing flashing lights or having blind spots. ? Seeing bright spots, halos, or zigzag lines. ? Having tunnel vision or blurred vision. ? Having numbness or a tingling feeling. ? Having trouble talking. ? Having weak muscles.  Some people have symptoms after a migraine headache (postdromal phase), such as: ? Tiredness. ? Trouble thinking (concentrating). How is this treated?  Taking medicines that: ? Relieve pain. ? Relieve the feeling of being sick to your stomach. ? Prevent migraine headaches.  Treatment may also include: ? Having acupuncture. ? Avoiding foods that bring on migraine headaches. ? Learning  ways to control your body functions (biofeedback). ? Therapy to help you know and deal with negative thoughts (cognitive behavioral therapy). Follow these instructions at home: Medicines  Take over-the-counter and prescription medicines only as told by your doctor.  Ask your doctor if the medicine prescribed to you: ? Requires you to avoid driving or using heavy machinery. ? Can cause trouble pooping (constipation). You may need to take these steps to prevent or treat trouble pooping:  Drink enough fluid to keep your pee (urine) pale yellow.  Take over-the-counter or prescription medicines.  Eat foods that are high in fiber. These include beans, whole grains, and fresh fruits and vegetables.  Limit foods that are high in fat and sugar. These include fried or sweet foods. Lifestyle  Do not drink alcohol.  Do not use any products that contain nicotine or tobacco, such as cigarettes, e-cigarettes, and chewing tobacco. If you need help quitting, ask your doctor.  Get at least 8 hours of sleep every night.  Limit and deal with stress. General instructions  Keep a journal to find out what may bring on your migraine headaches. For example, write down: ? What you eat and drink. ?  How much sleep you get. ? Any change in what you eat or drink. ? Any change in your medicines.  If you have a migraine headache: ? Avoid things that make your symptoms worse, such as bright lights. ? It may help to lie down in a dark, quiet room. ? Do not drive or use heavy machinery. ? Ask your doctor what activities are safe for you.  Keep all follow-up visits as told by your doctor. This is important.      Contact a doctor if:  You get a migraine headache that is different or worse than others you have had.  You have more than 15 headache days in one month. Get help right away if:  Your migraine headache gets very bad.  Your migraine headache lasts longer than 72 hours.  You have a  fever.  You have a stiff neck.  You have trouble seeing.  Your muscles feel weak or like you cannot control them.  You start to lose your balance a lot.  You start to have trouble walking.  You pass out (faint).  You have a seizure. Summary  A migraine headache is a very strong throbbing pain on one side or both sides of your head. These headaches can also cause other symptoms.  This condition may be treated with medicines and changes to your lifestyle.  Keep a journal to find out what may bring on your migraine headaches.  Contact a doctor if you get a migraine headache that is different or worse than others you have had.  Contact your doctor if you have more than 15 headache days in a month. This information is not intended to replace advice given to you by your health care provider. Make sure you discuss any questions you have with your health care provider. Document Revised: 01/14/2019 Document Reviewed: 11/04/2018 Elsevier Patient Education  [place].
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
## 2020-11-01 ENCOUNTER — Ambulatory Visit (INDEPENDENT_AMBULATORY_CARE_PROVIDER_SITE_OTHER): Payer: 59 | Admitting: Family Medicine

## 2020-11-01 ENCOUNTER — Other Ambulatory Visit: Payer: Self-pay

## 2020-11-01 ENCOUNTER — Encounter: Payer: Self-pay | Admitting: Family Medicine

## 2020-11-01 VITALS — BP 138/91 | HR 101 | Ht 63.0 in | Wt 251.2 lb

## 2020-11-01 DIAGNOSIS — G43709 Chronic migraine without aura, not intractable, without status migrainosus: Secondary | ICD-10-CM | POA: Diagnosis not present

## 2020-11-01 DIAGNOSIS — R519 Headache, unspecified: Secondary | ICD-10-CM | POA: Diagnosis not present

## 2020-11-01 MED ORDER — RIZATRIPTAN BENZOATE 10 MG PO TBDP: ORAL_TABLET | ORAL | 11 refills | Status: DC

## 2020-11-01 MED ORDER — VENLAFAXINE HCL ER 75 MG PO CP24: 75.0000 mg | ORAL_CAPSULE | Freq: Every day | ORAL | 4 refills | Status: DC

## 2020-11-01 MED ORDER — AJOVY 225 MG/1.5ML ~~LOC~~ SOAJ: 225.0000 mg | SUBCUTANEOUS | 3 refills | Status: DC

## 2021-01-28 ENCOUNTER — Other Ambulatory Visit: Payer: Self-pay | Admitting: Family Medicine

## 2021-01-28 DIAGNOSIS — Z1231 Encounter for screening mammogram for malignant neoplasm of breast: Secondary | ICD-10-CM

## 2021-02-08 ENCOUNTER — Emergency Department (HOSPITAL_COMMUNITY): Payer: 59

## 2021-02-08 ENCOUNTER — Emergency Department (HOSPITAL_COMMUNITY)
Admission: EM | Admit: 2021-02-08 | Discharge: 2021-02-08 | Disposition: A | Discharge status: Home / Self Care | Payer: 59 | Attending: Emergency Medicine | Admitting: Emergency Medicine

## 2021-02-08 ENCOUNTER — Encounter (HOSPITAL_COMMUNITY): Payer: Self-pay | Admitting: Emergency Medicine

## 2021-02-08 ENCOUNTER — Other Ambulatory Visit: Payer: Self-pay

## 2021-02-08 DIAGNOSIS — R1011 Right upper quadrant pain: Secondary | ICD-10-CM | POA: Insufficient documentation

## 2021-02-08 DIAGNOSIS — R112 Nausea with vomiting, unspecified: Secondary | ICD-10-CM | POA: Insufficient documentation

## 2021-02-08 DIAGNOSIS — Z8616 Personal history of COVID-19: Secondary | ICD-10-CM | POA: Diagnosis not present

## 2021-02-08 DIAGNOSIS — R52 Pain, unspecified: Secondary | ICD-10-CM

## 2021-02-08 LAB — URINALYSIS, ROUTINE W REFLEX MICROSCOPIC
Bilirubin Urine: NEGATIVE
Glucose, UA: NEGATIVE mg/dL
Hgb urine dipstick: NEGATIVE
Ketones, ur: NEGATIVE mg/dL
Nitrite: NEGATIVE
Protein, ur: NEGATIVE mg/dL
Specific Gravity, Urine: 1.017 (ref 1.005–1.030)
pH: 5 (ref 5.0–8.0)

## 2021-02-08 LAB — PREGNANCY, URINE: Preg Test, Ur: NEGATIVE

## 2021-02-08 MED ORDER — ONDANSETRON 4 MG PO TBDP: 4.0000 mg | ORAL_TABLET | Freq: Three times a day (TID) | ORAL | 0 refills | Status: AC | PRN

## 2021-02-08 MED ORDER — ONDANSETRON HCL 4 MG/2ML IJ SOLN
4.0000 mg | Freq: Once | INTRAMUSCULAR | Status: AC
  Administered 2021-02-08: 4 mg via INTRAVENOUS
  Filled 2021-02-08: qty 2

## 2021-02-08 MED ORDER — OMEPRAZOLE 20 MG PO CPDR: 20.0000 mg | DELAYED_RELEASE_CAPSULE | Freq: Every day | ORAL | 0 refills | Status: DC

## 2021-02-08 MED ORDER — IOHEXOL 300 MG/ML  SOLN
100.0000 mL | Freq: Once | INTRAMUSCULAR | Status: AC | PRN
  Administered 2021-02-08: 100 mL via INTRAVENOUS

## 2021-02-08 MED ORDER — FENTANYL CITRATE (PF) 100 MCG/2ML IJ SOLN
50.0000 ug | Freq: Once | INTRAMUSCULAR | Status: AC
  Administered 2021-02-08: 50 ug via INTRAVENOUS
  Filled 2021-02-08: qty 2

## 2021-02-08 NOTE — ED Notes (Signed)
Patient transported to [facility] 

## 2021-02-08 NOTE — ED Triage Notes (Signed)
Pt here with c/o abd pain that started on the right flank /back was at [facility]) had blood work and UA that was OK , has GI appointment on Monday

## 2021-02-08 NOTE — ED Provider Notes (Signed)
[facility] EMERGENCY DEPARTMENT Provider Note   CSN: [id number] Arrival date & time: 02/08/21  0717     History No chief complaint on file.   Candice Oconnor is a 50 y.o. female.  HPI Patient presents with nausea and vomiting.  Has had for the last few weeks.  Then developed right-sided flank/abdominal pain.  Seen at urgent care yesterday with reportedly reassuring blood work.  Now worsening symptoms.  Does seem to be worse after eating.  No blood in the emesis.  No fevers or chills.  Pain is in the upper abdomen.  Does not feel like previous kidney stones that she has had.    Past Medical History:  Diagnosis Date  . Chicken pox   . COVID-19   . HPV in female   . Hyperlipidemia   . Migraine   . Prediabetes   . WPW (Wolff-Parkinson-White syndrome)     Patient Active Problem List   Diagnosis Date Noted  . Other specified personal risk factors, not elsewhere classified 02/25/2019  . Family history of early CAD 02/25/2019  . COVID-19   . Wolff-Parkinson-White (WPW) syndrome 01/03/2019  . Precordial pain 12/29/2018  . Wolff-Parkinson-White (WPW) pattern seen on electrocardiography 12/29/2018  . Chronic migraine without aura without status migrainosus, not intractable 10/28/2018  . Blurry vision, bilateral 10/26/2017  . Frequent headaches 10/26/2017  . Mixed hyperlipidemia 08/31/2017  . Morbid obesity ([place]) 03/03/2012  . Heavy periods 03/03/2012  . Back pain 03/03/2012  . Urinary tract infection 03/03/2012    Past Surgical History:  Procedure Laterality Date  . EPIDURAL BLOOD [facility]  11/16/2017  . LUMBAR PUNCTURE  11/12/2017     OB History    Gravida  2   Para  1   Term      Preterm      AB      Living        SAB      IAB      Ectopic      Multiple      Live Births              Family History  Problem Relation Age of Onset  . Heart disease Mother   . Diabetes Mother   . Heart disease Father   . Diabetes Father   .  Breast cancer Neg Hx     Social History   Tobacco Use  . Smoking status: Never Smoker  . Smokeless tobacco: Never Used  Vaping Use  . Vaping Use: Never used  Substance Use Topics  . Alcohol use: Yes    Comment: occ  . 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 Authorizing Provider  omeprazole (PRILOSEC) 20 MG capsule Take 1 capsule (20 mg total) by mouth daily. 02/08/21  Yes Davonna Belling, MD  ondansetron (ZOFRAN-ODT) 4 MG disintegrating tablet Take 1 tablet (4 mg total) by mouth every 8 (eight) hours as needed for nausea or vomiting. 02/08/21  Yes Davonna Belling, MD  albuterol (VENTOLIN HFA) 108 (90 Base) MCG/ACT inhaler Inhale 2 puffs into the lungs every 6 (six) hours as needed for wheezing or shortness of breath. 02/02/19   Sharion Balloon, FNP  CANNABIDIOL PO Take by mouth.    [provider]  Fremanezumab-vfrm (AJOVY) 225 MG/1.5ML SOAJ Inject 225 mg into the skin every 30 (thirty) days. 11/01/20  Lomax, Amy, NP  montelukast (SINGULAIR) 10 MG tablet Take 10 mg by mouth at bedtime as needed (for seasonal "flares").     [provider]  pramipexole (MIRAPEX) 0.5 MG tablet Take 0.5 mg by mouth as needed.    [provider]  rizatriptan (MAXALT-MLT) 10 MG disintegrating tablet TAKE 1 TABLET AS NEEDED FOR MIGRAINE, MAY REPEAT IN 2 HOURS IF NEEDED 11/01/20   Lomax, Amy, NP  venlafaxine XR (EFFEXOR XR) 75 MG 24 hr capsule Take 1 capsule (75 mg total) by mouth daily with breakfast. 11/01/20   Lomax, Amy, NP    Allergies    Cayenne, Propranolol, Tetanus toxoid, and Tetanus toxoids  Review of Systems   Review of Systems  Constitutional: Positive for appetite change.  HENT: Negative for congestion.   Respiratory: Negative for shortness of breath.   Cardiovascular: Negative for chest pain.  Gastrointestinal: Positive for abdominal pain,  nausea and vomiting.  Genitourinary: Negative for flank pain.  Musculoskeletal: Negative for back pain.  Skin: Negative for rash.  Neurological: Negative for weakness.  Psychiatric/Behavioral: Negative for confusion.    Physical Exam Updated Vital Signs BP 119/75 (BP Location: Left Arm)   Pulse 86   Temp 98.9 F (37.2 C) (Oral)   Resp 17   Ht 5\' 3"  (1.6 m)   Wt 112 kg   SpO2 98%   BMI 43.75 kg/m   Physical Exam Vitals and nursing note reviewed.  HENT:     Head: Atraumatic.     Mouth/Throat:     Mouth: Mucous membranes are moist.  Eyes:     Pupils: Pupils are equal, round, and reactive to light.  Cardiovascular:     Rate and Rhythm: Regular rhythm.  Pulmonary:     Breath sounds: No wheezing or rhonchi.  Abdominal:     Tenderness: There is abdominal tenderness.     Comments: Right upper quadrant tenderness without rebound or guarding.  No hernia palpated.  Musculoskeletal:        General: No tenderness.     Cervical back: Neck supple.  Skin:    General: Skin is warm.     Capillary Refill: Capillary refill takes less than 2 seconds.  Neurological:     Mental Status: She is alert and oriented to person, place, and time.     ED Results / Procedures / Treatments   Labs (all labs ordered are listed, but only abnormal results are displayed) Labs Reviewed  URINALYSIS, ROUTINE W REFLEX MICROSCOPIC - Abnormal; Notable for the following components:      Result Value   APPearance HAZY (*)    Leukocytes,Ua SMALL (*)    Bacteria, UA RARE (*)    All other components within normal limits  PREGNANCY, URINE    EKG None  Radiology CT ABDOMEN PELVIS W CONTRAST  Result Date: 02/08/2021 CLINICAL DATA:  Nausea/vomiting EXAM: CT ABDOMEN AND PELVIS WITH CONTRAST TECHNIQUE: Multidetector CT imaging of the abdomen and pelvis was performed using the standard protocol following bolus administration of intravenous contrast. CONTRAST:  142mL OMNIPAQUE IOHEXOL 300 MG/ML  SOLN  COMPARISON:  CT 01/29/2019 FINDINGS: Lower chest: Small hiatal hernia.  The lung bases are clear. Hepatobiliary: No focal liver abnormality is seen. No gallstones, gallbladder wall thickening, or biliary dilatation. Pancreas: Unremarkable. No pancreatic ductal dilatation or surrounding inflammatory changes. Spleen: Normal in size without focal abnormality. Adrenals/Urinary Tract: Adrenal glands are unremarkable. Kidneys are normal, without renal calculi, focal lesion, or hydronephrosis. Bladder is unremarkable. Stomach/Bowel: Stomach is within  normal limits. No evidence of appendicitis. No evidence of bowel wall thickening, distention, or inflammatory changes. Vascular/Lymphatic: No significant vascular findings are present. No enlarged abdominal or pelvic lymph nodes. Reproductive: Uterus and bilateral adnexa are unremarkable. Other: No abdominal wall hernia or abnormality. No abdominopelvic ascites. Musculoskeletal: No acute or significant osseous findings. IMPRESSION: No acute abdominopelvic abnormality. Small hiatal hernia Electronically Signed   By: Maurine Simmering   On: 02/08/2021 15:08   US Abdomen Limited RUQ (LIVER/GB)  Result Date: 02/08/2021 CLINICAL DATA:  Upper abdominal pain EXAM: ULTRASOUND ABDOMEN LIMITED RIGHT UPPER QUADRANT COMPARISON:  None. FINDINGS: Gallbladder: Within the gallbladder, there are 4 echogenic foci which neither move nor shadow consistent with polyps. Largest polyp measures 7 mm in length. No polyps measure 4-6 mm. There are no echogenic foci in the gallbladder which move and shadow as is expected with cholelithiasis. No gallbladder wall thickening or pericholecystic fluid. No sonographic Murphy sign noted by sonographer. Common bile duct: Diameter: 4 mm. No intrahepatic or extrahepatic biliary duct dilatation. Liver: No focal lesion identified. Within normal limits in parenchymal echogenicity. Portal vein is patent on color Doppler imaging with normal direction of blood flow  towards the liver. Other: None. IMPRESSION: Several polyps within the gallbladder, largest measuring 7 mm. Per consensus guidelines, a polyp of of this size warrants a follow-up ultrasound in 1 year to assess for stability. No gallstones, gallbladder wall thickening, or pericholecystic fluid. Study otherwise unremarkable. Electronically Signed   By: Lowella Grip III M.D.   On: 02/08/2021 08:39    Procedures Procedures   Medications Ordered in ED Medications  ondansetron (ZOFRAN) injection 4 mg (4 mg Intravenous Given 02/08/21 0928)  fentaNYL (SUBLIMAZE) injection 50 mcg (50 mcg Intravenous Given 02/08/21 1304)  iohexol (OMNIPAQUE) 300 MG/ML solution 100 mL (100 mLs Intravenous Contrast Given 02/08/21 1433)  ondansetron (ZOFRAN) injection 4 mg (4 mg Intravenous Given 02/08/21 1543)    ED Course  I have reviewed the triage vital signs and the nursing notes.  Pertinent labs & imaging results that were available during my care of the patient were reviewed by me and considered in my medical decision making (see chart for details).    MDM Rules/Calculators/A&P                          Patient presents with upper abdominal pain.  Has had now for some weeks.  Seen at urgent care and had lab work that was reviewed and was reassuring.  Pain is worse after eating.  Have been scheduled for an ultrasound.  Ultrasound was done here and showed some gallbladder polyps and will need to be followed but no other signs of cholecystitis.  With continued pain CT scan done and was reassuring.  Also not a clear cause of the pain.  Potential causes such as gastritis also considered.  Will start on proton pump inhibitor.  Also will give nausea medicine.  Has GI follow-up arranged and will follow with them.  Discharge home.   Final Clinical Impression(s) / ED Diagnoses Final diagnoses:  Pain  Right upper quadrant abdominal pain    Rx / DC Orders ED Discharge Orders         Ordered    ondansetron (ZOFRAN-ODT) 4  MG disintegrating tablet  Every 8 hours PRN        02/08/21 1533    omeprazole (PRILOSEC) 20 MG capsule  Daily        02/08/21 1533  Davonna Belling, MD 02/08/21 1556

## 2021-02-08 NOTE — Discharge Instructions (Signed)
Follow-up with a gastroenterologist as planned.  Take the new medicines which will hopefully help with the symptoms.

## 2021-02-08 NOTE — ED Notes (Signed)
Patient transported to CT 

## 2021-03-19 ENCOUNTER — Other Ambulatory Visit: Payer: Self-pay

## 2021-03-19 ENCOUNTER — Ambulatory Visit
Admission: RE | Admit: 2021-03-19 | Discharge: 2021-03-19 | Disposition: A | Discharge status: Home / Self Care | Payer: 59 | Source: Ambulatory Visit | Attending: Family Medicine | Admitting: Family Medicine

## 2021-03-19 DIAGNOSIS — Z1231 Encounter for screening mammogram for malignant neoplasm of breast: Secondary | ICD-10-CM

## 2021-07-01 ENCOUNTER — Telehealth: Payer: Self-pay | Admitting: *Deleted

## 2021-07-01 NOTE — Telephone Encounter (Signed)
Called patient to see about the PA for Aimovig but patient is already on Ajovy. Calling to see how Candice Oconnor is working for patient

## 2021-07-03 NOTE — Telephone Encounter (Signed)
Pt returned phone call, Candice Oconnor is going great. 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

## 2021-07-26 ENCOUNTER — Encounter (HOSPITAL_BASED_OUTPATIENT_CLINIC_OR_DEPARTMENT_OTHER): Payer: Self-pay | Admitting: Emergency Medicine

## 2021-07-26 ENCOUNTER — Other Ambulatory Visit: Payer: Self-pay

## 2021-07-26 ENCOUNTER — Emergency Department (HOSPITAL_BASED_OUTPATIENT_CLINIC_OR_DEPARTMENT_OTHER)
Admission: EM | Admit: 2021-07-26 | Discharge: 2021-07-26 | Discharge status: Against Medical Advice | Payer: BC Managed Care – PPO | Attending: Emergency Medicine | Admitting: Emergency Medicine

## 2021-07-26 DIAGNOSIS — R079 Chest pain, unspecified: Secondary | ICD-10-CM | POA: Diagnosis not present

## 2021-07-26 DIAGNOSIS — R0602 Shortness of breath: Secondary | ICD-10-CM | POA: Diagnosis present

## 2021-07-26 DIAGNOSIS — Z8616 Personal history of COVID-19: Secondary | ICD-10-CM | POA: Diagnosis not present

## 2021-07-26 DIAGNOSIS — R0600 Dyspnea, unspecified: Secondary | ICD-10-CM

## 2021-07-26 LAB — CBC
HCT: 36.5 % (ref 36.0–46.0)
Hemoglobin: 11.8 g/dL — ABNORMAL LOW (ref 12.0–15.0)
MCH: 25.9 pg — ABNORMAL LOW (ref 26.0–34.0)
MCHC: 32.3 g/dL (ref 30.0–36.0)
MCV: 80 fL (ref 80.0–100.0)
Platelets: 337 10*3/uL (ref 150–400)
RBC: 4.56 MIL/uL (ref 3.87–5.11)
RDW: 14.6 % (ref 11.5–15.5)
WBC: 11.7 10*3/uL — ABNORMAL HIGH (ref 4.0–10.5)
nRBC: 0 % (ref 0.0–0.2)

## 2021-07-26 LAB — BASIC METABOLIC PANEL
Anion gap: 11 (ref 5–15)
BUN: 13 mg/dL (ref 6–20)
CO2: 22 mmol/L (ref 22–32)
Calcium: 9.1 mg/dL (ref 8.9–10.3)
Chloride: 104 mmol/L (ref 98–111)
Creatinine, Ser: 0.95 mg/dL (ref 0.44–1.00)
GFR, Estimated: >60 mL/min (ref >60)
Glucose, Bld: 115 mg/dL — ABNORMAL HIGH (ref 70–99)
Potassium: 3.7 mmol/L (ref 3.5–5.1)
Sodium: 137 mmol/L (ref 135–145)

## 2021-07-26 LAB — D-DIMER, QUANTITATIVE: D-Dimer, Quant: 0.28 ug/mL-FEU (ref 0.00–0.50)

## 2021-07-26 LAB — TROPONIN I (HIGH SENSITIVITY): Troponin I (High Sensitivity): 3 ng/L (ref <18)

## 2021-07-26 NOTE — Discharge Instructions (Addendum)
You are leaving before the work-up was completed.  From a cardiac standpoint things look good.  D-dimer is not resulted.  You are aware of the risks.  If it is elevated you will need to return for CT of the chest to look for blood clot.

## 2021-07-26 NOTE — ED Notes (Signed)
Pt is not wanting to stay for d-dimer results. MD notified. Pt made aware of the risks of leaving. Verbal AMA consent obtained.

## 2021-07-26 NOTE — ED Provider Notes (Signed)
[facility]ERGENC[facility] DEPT Provider Note   CSN: [id number] Arrival date & time: 07/26/21  1746     History Chief Complaint  Patient presents with   Chest Pain    Candice Oconnor is a 50 y.o. female.   Chest Pain Associated symptoms: shortness of breath   Associated symptoms: no abdominal pain, no back pain and no numbness   Patient presents chest pain shortness of breath.  Has had for the last few weeks.  Had COVID a month and a half ago has not really come back to normal.  Has had some dull chest pain at times.  Went to urgent care and was sent in to rule out blood clots to the lung.  No fevers or chills.  No coughing now.  No swelling in her legs.  Thinks she may have a long COVID no history of cardiac ischemia.  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  Has had previous cardiac work-up with [facility] cardiology.    Past Medical History:  Diagnosis Date   Chicken pox    COVID-19    HPV in female    Hyperlipidemia    Migraine    Prediabetes    WPW (Wolff-Parkinson-White syndrome)     Patient Active Problem List   Diagnosis Date Noted   Other specified personal risk factors, not elsewhere classified 02/25/2019   Family history of early CAD 02/25/2019   COVID-19    Wolff-Parkinson-White (WPW) syndrome 01/03/2019   Precordial pain 12/29/2018   Wolff-Parkinson-White (WPW) pattern seen on electrocardiography 12/29/2018   Chronic migraine without aura without status migrainosus, not intractable 10/28/2018   Blurry vision, bilateral 10/26/2017   Frequent headaches 10/26/2017   Mixed hyperlipidemia 08/31/2017   Morbid obesity ([place]) 03/03/2012   Heavy periods 03/03/2012   Back pain 03/03/2012   Urinary tract infection 03/03/2012    Past Surgical History:  Procedure Laterality Date   EPIDURAL BLOOD PATCH  11/16/2017   LUMBAR PUNCTURE  11/12/2017     OB History     Gravida  2   Para  1   Term      Preterm      AB      Living         SAB       IAB      Ectopic      Multiple      Live Births              Family History  Problem Relation Age of Onset   Heart disease Mother    Diabetes Mother    Heart disease Father    Diabetes Father    Breast cancer Neg Hx     Social History   Tobacco Use   Smoking status: Never   Smokeless tobacco: Never  Vaping Use   Vaping Use: Never used  Substance Use Topics   Alcohol use: Yes    Comment: occ   Drug use: No    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 Authorizing Provider  albuterol (VENTOLIN HFA) 108 (90 Base) MCG/ACT inhaler Inhale 2 puffs into the lungs every 6 (six) hours as needed for wheezing or shortness of breath. 02/02/19   Sharion Balloon, FNP  CANNABIDIOL PO Take by mouth.    [provider]  Fremanezumab-vfrm (AJOVY) 225 MG/1.5ML SOAJ Inject 225 mg into the skin every 30 (thirty) days.  11/01/20   Lomax, Amy, NP  montelukast (SINGULAIR) 10 MG tablet Take 10 mg by mouth at bedtime as needed (for seasonal "flares").     [provider]  omeprazole (PRILOSEC) 20 MG capsule Take 1 capsule (20 mg total) by mouth daily. 02/08/21   Davonna Belling, MD  ondansetron (ZOFRAN-ODT) 4 MG disintegrating tablet Take 1 tablet (4 mg total) by mouth every 8 (eight) hours as needed for nausea or vomiting. 02/08/21   Davonna Belling, MD  pramipexole (MIRAPEX) 0.5 MG tablet Take 0.5 mg by mouth as needed.    [provider]  rizatriptan (MAXALT-MLT) 10 MG disintegrating tablet TAKE 1 TABLET AS NEEDED FOR MIGRAINE, MAY REPEAT IN 2 HOURS IF NEEDED 11/01/20   Lomax, Amy, NP  venlafaxine XR (EFFEXOR XR) 75 MG 24 hr capsule Take 1 capsule (75 mg total) by mouth daily with breakfast. 11/01/20   Lomax, Amy, NP    Allergies    Cayenne, Propranolol, Tetanus toxoid, and Tetanus toxoids  Review of Systems   Review of Systems  Constitutional:  Negative for  appetite change.  HENT:  Negative for congestion.   Respiratory:  Positive for shortness of breath.   Cardiovascular:  Positive for chest pain.  Gastrointestinal:  Negative for abdominal pain.  Genitourinary:  Negative for flank pain.  Musculoskeletal:  Negative for back pain.  Skin:  Negative for rash.  Neurological:  Negative for numbness.  Psychiatric/Behavioral:  Negative for confusion.    Physical Exam Updated Vital Signs BP 121/80   Pulse 98   Temp 98.3 F (36.8 C) (Oral)   Resp 18   Ht 5\' 3"  (1.6 m)   Wt 108.9 kg   LMP 07/15/2021 (Approximate)   SpO2 96%   BMI 42.51 kg/m   Physical Exam Vitals and nursing note reviewed.  HENT:     Head: Atraumatic.  Neck:     Vascular: No JVD.  Cardiovascular:     Rate and Rhythm: Normal rate and regular rhythm.  Pulmonary:     Breath sounds: No decreased breath sounds, wheezing, rhonchi or rales.  Chest:     Chest wall: No tenderness.  Abdominal:     Tenderness: There is no abdominal tenderness.  Musculoskeletal:     Cervical back: Neck supple.     Right lower leg: No edema.     Left lower leg: No edema.  Skin:    General: Skin is warm.     Capillary Refill: Capillary refill takes less than 2 seconds.  Neurological:     Mental Status: She is alert.    ED Results / Procedures / Treatments   Labs (all labs ordered are listed, but only abnormal results are displayed) Labs Reviewed  BASIC METABOLIC PANEL - Abnormal; Notable for the following components:      Result Value   Glucose, Bld 115 (*)    All other components within normal limits  CBC - Abnormal; Notable for the following components:   WBC 11.7 (*)    Hemoglobin 11.8 (*)    MCH 25.9 (*)    All other components within normal limits  D-DIMER, QUANTITATIVE  TROPONIN I (HIGH SENSITIVITY)    EKG EKG Interpretation  Date/Time:  Friday July 26 2021 17:59:16 EDT Ventricular Rate:  105 PR Interval:  124 QRS Duration: 96 QT Interval:  368 QTC  Calculation: 486 R Axis:   75 Text Interpretation: Sinus tachycardia Lateral infarct , age undetermined Abnormal ECG WPW? Confirmed by Davonna Belling [phone]) on 07/26/2021 7:57:19  PM  Radiology No results found.  Procedures Procedures   Medications Ordered in ED Medications - No data to display  ED Course  I have reviewed the triage vital signs and the nursing notes.  Pertinent labs & imaging results that were available during my care of the patient were reviewed by me and considered in my medical decision making (see chart for details).    MDM Rules/Calculators/A&P                           Patient presents with shortness of breath and some chest pain.  Has been going for a few weeks.  Had COVID not too long ago.  Sending to rule out blood clot.  Patient however states she is got to leave to get to be with her family.  We will get D-dimer and patient will follow it as an outpatient.  Leaving AMA.  Doubt cardiac cause of the pain.  EKG reassuring.  Troponin negative.  Has had previous cardiac work-up although it does not look like she got the cardiac calcium scoring that cardiology was recommending.  Has family history but story does not really sound ischemic at this time.  Patient is leaving AMA and is aware of risk Final Clinical Impression(s) / ED Diagnoses Final diagnoses:  Dyspnea, unspecified type  Nonspecific chest pain    Rx / DC Orders ED Discharge Orders     None        Davonna Belling, MD 07/26/21 2028

## 2021-07-26 NOTE — ED Triage Notes (Signed)
Pt seen in UC for chest pain and fatigue and sent to ED for further eval.  Pt reports DOE.

## 2021-09-05 ENCOUNTER — Telehealth: Payer: Self-pay | Admitting: Family Medicine

## 2021-09-05 NOTE — Telephone Encounter (Signed)
Pt left a vm stating she is 2 months behind on her Fremanezumab-vfrm (AJOVY) 225 MG/1.5ML SOAJ. Having to take her rizatriptan (MAXALT-MLT) 10 MG disintegrating tablet, wanting to know what to do. Pt requesting a call back.

## 2021-09-05 NOTE — Telephone Encounter (Signed)
Looks like she needed a [place] completed which we had not received. PA is done and waiting on determination. KEY: [id number]  Will await response

## 2021-09-06 NOTE — Telephone Encounter (Signed)
[employer] [place] [employer] called, for Ajovy do not have continuation coverage and prior approval with [place]. Will need initial portion of the form filled out. We will be faxing over correct form that needs to filled out  Fax to: [phone]

## 2021-09-06 NOTE — Telephone Encounter (Signed)
Resubmitted the PA as a initial authorization.  FSE:[id number] Will wait to hear response

## 2021-09-09 NOTE — Telephone Encounter (Signed)
PA approved effective from 09/05/2021 through 11/27/2021.

## 2021-11-06 NOTE — Patient Instructions (Signed)
Below is our plan:  We will continue Ajovy monthly, venlafaxine 75mg  daily and rizatriptan as needed. You can pair rizatriptan with Tylenol and ibuprofen OR Aleve as directed by packaging. Do not use any abortive medicaitons regularly.   Please make sure you are staying well hydrated. I recommend 50-60 ounces daily. Well balanced diet and regular exercise encouraged. Consistent sleep schedule with 6-8 hours recommended.   Please continue follow up with care team as directed.   Follow up with me in 1 year   You may receive a survey regarding today's visit. I encourage you to leave honest feed back as I do use this information to improve patient care. Thank you for seeing me today!
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
## 2021-11-07 ENCOUNTER — Other Ambulatory Visit: Payer: Self-pay

## 2021-11-07 ENCOUNTER — Telehealth: Payer: Self-pay

## 2021-11-07 ENCOUNTER — Ambulatory Visit (INDEPENDENT_AMBULATORY_CARE_PROVIDER_SITE_OTHER): Payer: BC Managed Care – PPO | Admitting: Family Medicine

## 2021-11-07 ENCOUNTER — Other Ambulatory Visit: Payer: Self-pay | Admitting: Family Medicine

## 2021-11-07 ENCOUNTER — Encounter: Payer: Self-pay | Admitting: Family Medicine

## 2021-11-07 DIAGNOSIS — G43709 Chronic migraine without aura, not intractable, without status migrainosus: Secondary | ICD-10-CM

## 2021-11-07 MED ORDER — AJOVY 225 MG/1.5ML ~~LOC~~ SOAJ: 225.0000 mg | SUBCUTANEOUS | 3 refills | Status: DC

## 2021-11-07 MED ORDER — RIZATRIPTAN BENZOATE 10 MG PO TBDP: ORAL_TABLET | ORAL | 11 refills | Status: DC

## 2021-11-07 MED ORDER — VENLAFAXINE HCL ER 75 MG PO CP24: 75.0000 mg | ORAL_CAPSULE | Freq: Every day | ORAL | 4 refills | Status: DC

## 2021-11-07 MED ORDER — RIZATRIPTAN BENZOATE 10 MG PO TABS: 10.0000 mg | ORAL_TABLET | ORAL | 11 refills | Status: DC | PRN

## 2021-11-07 NOTE — Telephone Encounter (Signed)
Called and lvm. Insurance is requiring a Prior Authorization for her disintegrating tablet, Rizatriptan.   Insurance however will cover regular pill form of rizatriptan with out the need of a PA. If pt ok with getting regular tablet form of rizatriptan we can then send order to pharmacy.

## 2021-11-07 NOTE — Telephone Encounter (Signed)
Rizatriptan ODT is requiring PA Rizatriptan tab, PA not required. Waiting on pt to approve tablet form.

## 2021-11-07 NOTE — Telephone Encounter (Signed)
Pt called back and is ok with tablet form.

## 2021-12-10 ENCOUNTER — Other Ambulatory Visit: Payer: Self-pay | Admitting: Family Medicine

## 2021-12-10 DIAGNOSIS — N939 Abnormal uterine and vaginal bleeding, unspecified: Secondary | ICD-10-CM

## 2021-12-10 DIAGNOSIS — D259 Leiomyoma of uterus, unspecified: Secondary | ICD-10-CM

## 2021-12-12 ENCOUNTER — Ambulatory Visit
Admission: RE | Admit: 2021-12-12 | Discharge: 2021-12-12 | Disposition: A | Discharge status: Home / Self Care | Payer: 59 | Source: Ambulatory Visit | Attending: Family Medicine | Admitting: Family Medicine

## 2021-12-12 DIAGNOSIS — D259 Leiomyoma of uterus, unspecified: Secondary | ICD-10-CM

## 2021-12-12 DIAGNOSIS — N939 Abnormal uterine and vaginal bleeding, unspecified: Secondary | ICD-10-CM

## 2022-05-12 ENCOUNTER — Telehealth: Payer: Self-pay | Admitting: Family Medicine

## 2022-05-12 ENCOUNTER — Telehealth: Payer: Self-pay | Admitting: Neurology

## 2022-05-12 MED ORDER — AIMOVIG 140 MG/ML ~~LOC~~ SOAJ: 140.0000 mg | SUBCUTANEOUS | 5 refills | Status: DC

## 2022-05-12 NOTE — Telephone Encounter (Signed)
Called [facility]- [phone]. [facility]- went to initiate PA for the patient per plan they require pt try aimovig or emgality first. Patient has tolerated aimovig well in past an only switched because of the insurance last time preferring ajovy. Will switch back to aimovig.  Aimovig PA was initiated over the phone.  PA#- [id number] Office notes were required to be sent in and they were faxed to [phone]. Confirmation received. Will await determination.

## 2022-05-12 NOTE — Telephone Encounter (Signed)
Called the patient back. She was stating that she had another insurance change and was under the impression that the new insurance didn't cover the Ajovy so she has not been taking it. I advised I don't see where we have actively completed a PA with the new insurance. Advised I would try and complete that and see if we can get that covered she states it was working.

## 2022-05-12 NOTE — Telephone Encounter (Signed)
Pt is calling and said she has had a migraine headache since Sunday. Pt said can Amy write her a prescription for something that going to help the migraine.

## 2022-05-13 ENCOUNTER — Encounter (HOSPITAL_BASED_OUTPATIENT_CLINIC_OR_DEPARTMENT_OTHER): Payer: Self-pay

## 2022-05-13 ENCOUNTER — Emergency Department (HOSPITAL_BASED_OUTPATIENT_CLINIC_OR_DEPARTMENT_OTHER)
Admission: EM | Admit: 2022-05-13 | Discharge: 2022-05-13 | Disposition: A | Discharge status: Home / Self Care | Payer: 59 | Attending: Emergency Medicine | Admitting: Emergency Medicine

## 2022-05-13 ENCOUNTER — Other Ambulatory Visit: Payer: Self-pay

## 2022-05-13 DIAGNOSIS — R519 Headache, unspecified: Secondary | ICD-10-CM | POA: Diagnosis present

## 2022-05-13 DIAGNOSIS — G43809 Other migraine, not intractable, without status migrainosus: Secondary | ICD-10-CM | POA: Diagnosis not present

## 2022-05-13 MED ORDER — DIPHENHYDRAMINE HCL 25 MG PO TABS: 25.0000 mg | ORAL_TABLET | Freq: Three times a day (TID) | ORAL | 0 refills | Status: DC | PRN

## 2022-05-13 MED ORDER — METOCLOPRAMIDE HCL 5 MG/ML IJ SOLN
10.0000 mg | Freq: Once | INTRAMUSCULAR | Status: AC
  Administered 2022-05-13: 10 mg via INTRAVENOUS
  Filled 2022-05-13: qty 2

## 2022-05-13 MED ORDER — DEXAMETHASONE SODIUM PHOSPHATE 10 MG/ML IJ SOLN
8.0000 mg | Freq: Once | INTRAMUSCULAR | Status: AC
  Administered 2022-05-13: 8 mg via INTRAVENOUS
  Filled 2022-05-13: qty 1

## 2022-05-13 MED ORDER — KETOROLAC TROMETHAMINE 15 MG/ML IJ SOLN
15.0000 mg | Freq: Once | INTRAMUSCULAR | Status: AC
  Administered 2022-05-13: 15 mg via INTRAVENOUS
  Filled 2022-05-13: qty 1

## 2022-05-13 MED ORDER — SODIUM CHLORIDE 0.9 % IV BOLUS
1000.0000 mL | Freq: Once | INTRAVENOUS | Status: AC
  Administered 2022-05-13: 1000 mL via INTRAVENOUS

## 2022-05-13 MED ORDER — METOCLOPRAMIDE HCL 10 MG PO TABS: 10.0000 mg | ORAL_TABLET | Freq: Three times a day (TID) | ORAL | 0 refills | Status: DC | PRN

## 2022-05-13 MED ORDER — DIPHENHYDRAMINE HCL 50 MG/ML IJ SOLN
12.5000 mg | Freq: Once | INTRAMUSCULAR | Status: AC
  Administered 2022-05-13: 12.5 mg via INTRAVENOUS
  Filled 2022-05-13: qty 1

## 2022-05-13 NOTE — ED Provider Notes (Signed)
[facility] Provider Note   CSN: [id number] Arrival date & time: 05/13/22  5329     History  Chief Complaint  Patient presents with   Migraine    Candice Oconnor is a 51 y.o. female presenting to emergency department with complaint of a headache.  The patient reports that she suffers from chronic headaches, has been seeing a neurologist for a long time for this, and her insurance company recently stopped covering her monthly injections.  Since then she has had a flareup in the frequency of her headaches, and has now had a headache ongoing for 3 days since Saturday.  She reports sensitivity to light.  It is a typical migraine pattern otherwise.  She has taking her other maintenance medications.  HPI     Home Medications Prior to Admission medications   Medication Sig Start Date End Date Taking? Authorizing Provider  diphenhydrAMINE (BENADRYL) 25 MG tablet Take 1 tablet (25 mg total) by mouth every 8 (eight) hours as needed for up to 15 doses for sleep. 05/13/22  Yes Marilyn Wing, Carola Rhine, MD  metoCLOPramide (REGLAN) 10 MG tablet Take 1 tablet (10 mg total) by mouth every 8 (eight) hours as needed for up to 10 doses for nausea. 05/13/22  Yes Cathrine Krizan, Carola Rhine, MD  albuterol (VENTOLIN HFA) 108 (90 Base) MCG/ACT inhaler Inhale 2 puffs into the lungs every 6 (six) hours as needed for wheezing or shortness of breath. 02/02/19   Evelina Dun A, FNP  atorvastatin (LIPITOR) 40 MG tablet Take 40 mg by mouth daily. 03/12/22   [provider]  CANNABIDIOL PO Take by mouth.    [provider]  Erenumab-aooe (AIMOVIG) 140 MG/ML SOAJ Inject 140 mg into the skin every 30 (thirty) days. 05/12/22   Lomax, Amy, NP  montelukast (SINGULAIR) 10 MG tablet Take 10 mg by mouth at bedtime as needed (for seasonal "flares").     [provider]  omeprazole (PRILOSEC) 20 MG capsule Take 1 capsule (20 mg total) by mouth daily. 02/08/21   Davonna Belling, MD  ondansetron  (ZOFRAN-ODT) 4 MG disintegrating tablet Take 1 tablet (4 mg total) by mouth every 8 (eight) hours as needed for nausea or vomiting. 02/08/21   Davonna Belling, MD  pramipexole (MIRAPEX) 0.5 MG tablet Take 0.5 mg by mouth as needed.    [provider]  rizatriptan (MAXALT) 10 MG tablet Take 1 tablet (10 mg total) by mouth as needed for migraine. May repeat in 2 hours if needed 11/07/21   Lomax, Amy, NP  venlafaxine XR (EFFEXOR XR) 75 MG 24 hr capsule Take 1 capsule (75 mg total) by mouth daily with breakfast. 11/07/21   Lomax, Amy, NP      Allergies    Cayenne, Propranolol, Tetanus toxoid, and Tetanus toxoids    Review of Systems   Review of Systems  Physical Exam Updated Vital Signs BP 123/75   Pulse 85   Temp 98.4 F (36.9 C) (Oral)   Resp 18   Ht '5\' 3"'$  (1.6 m)   Wt 105.2 kg   SpO2 94%   BMI 41.10 kg/m  Physical Exam Constitutional:      General: She is not in acute distress.    Comments: Lying in dark holding forehead  HENT:     Head: Normocephalic and atraumatic.  Eyes:     Conjunctiva/sclera: Conjunctivae normal.     Pupils: Pupils are equal, round, and reactive to light.  Cardiovascular:     Rate and  Rhythm: Normal rate and regular rhythm.  Pulmonary:     Effort: Pulmonary effort is normal. No respiratory distress.  Abdominal:     General: There is no distension.     Tenderness: There is no abdominal tenderness.  Skin:    General: Skin is warm and dry.  Neurological:     General: No focal deficit present.     Mental Status: She is alert and oriented to person, place, and time. Mental status is at baseline.  Psychiatric:        Mood and Affect: Mood normal.        Behavior: Behavior normal.     ED Results / Procedures / Treatments   Labs (all labs ordered are listed, but only abnormal results are displayed) Labs Reviewed - No data to display  EKG None  Radiology No results found.  Procedures Procedures    Medications Ordered in  ED Medications  sodium chloride 0.9 % bolus 1,000 mL (0 mLs Intravenous Stopped 05/13/22 0947)  ketorolac (TORADOL) 15 MG/ML injection 15 mg (15 mg Intravenous Given 05/13/22 0813)  diphenhydrAMINE (BENADRYL) injection 12.5 mg (12.5 mg Intravenous Given 05/13/22 0815)  metoCLOPramide (REGLAN) injection 10 mg (10 mg Intravenous Given 05/13/22 0813)  dexamethasone (DECADRON) injection 8 mg (8 mg Intravenous Given 05/13/22 0814)    ED Course/ Medical Decision Making/ A&P Clinical Course as of 05/13/22 0952  Tue May 13, 2022  0950 Migraine significantly improved.  Will discharge - pt calling [employer] to take her home.  Will f/u with neurologist. [MT]    Clinical Course User Index [MT] Ladonna Vanorder, Carola Rhine, MD                           Medical Decision Making Risk OTC drugs. Prescription drug management.   This patient presents to the Emergency Department with complaint of headache.  This involves an extensive number of treatment options, and is a complaint that carries with it a high risk of complications and morbidity.  The differential diagnosis for headache includes tension type headache vs occipital headache vs migraine vs sinusitis vs iother  Patient the patient clinical description and her presentation, I suspect this is likely a breakthrough of her chronic recurring migraines.  We will try an IV migraine cocktail here.  This would include Decadron which may provide her some longer relief until she can follow-up with a neurologist.  She is already taking Effexor and a triptan.  I reviewed her external records including MRI of the brain 2019 and lumbar puncture which did not show any significant pathology or sign of intracranial hypertension, per physician's note.  I have a very low suspicion for acute meningitis or intracranial bleed or subarachnoid hemorrhage based on his presentation.  I do not believe need emergent repeat imaging of the brain.  I reevaluated the patient after medications, and  found that significant improvement of her symptoms   After the interventions stated above, I reevaluated the patient and found that the patient remained clinically stable.  Based on the patient's clinical exam, vital signs, risk factors, and ED testing, I felt that the patient's overall risk of life-threatening emergency such as ICH, meningitis, intracranial mass or tumor was quite low.  I suspect this clinical presentation is most consistent with chronic recurring migraine, but explained to the patient that this evaluation was not a definitive diagnostic workup.  I discussed outpatient follow up with primary care provider, and provided specialist office number  on the patient's discharge paper if a referral was deemed necessary.  I discussed return precautions with the patient. I felt the patient was clinically stable for discharge.         Final Clinical Impression(s) / ED Diagnoses Final diagnoses:  Other migraine without status migrainosus, not intractable    Rx / DC Orders ED Discharge Orders          Ordered    metoCLOPramide (REGLAN) 10 MG tablet  Every 8 hours PRN        05/13/22 0951    diphenhydrAMINE (BENADRYL) 25 MG tablet  Every 8 hours PRN        05/13/22 0951              Wyvonnia Dusky, MD 05/13/22 [phone]

## 2022-05-13 NOTE — ED Triage Notes (Signed)
Pt w/hx of migraines seeing neurologist for it. Insurance won't cover the meds she needs so having migraines.

## 2022-05-13 NOTE — Discharge Instructions (Addendum)
Please call your neurology office to schedule a follow-up appointment as soon as possible for your migraine.  You were given a shot of steroids called Decadron in the ER today which may help with the next 2 to 3 days of headache pain.  I also prescribed you Reglan which is a medicine that can treat migraines.  You can take Reglan with 25 mg Benadryl at night before bedtime to help you sleep.  You can also consider taking 600 mg of ibuprofen with these medications at night.

## 2022-05-16 ENCOUNTER — Encounter: Payer: Self-pay | Admitting: Family Medicine

## 2022-05-19 MED ORDER — AIMOVIG 140 MG/ML ~~LOC~~ SOAJ: 140.0000 mg | Freq: Once | SUBCUTANEOUS | 0 refills | Status: AC

## 2022-05-19 NOTE — Addendum Note (Signed)
Addended by: Darleen Crocker on: 05/19/2022 01:40 PM   Modules accepted: Orders

## 2022-09-04 ENCOUNTER — Telehealth: Payer: Self-pay | Admitting: Neurology

## 2022-09-04 NOTE — Telephone Encounter (Signed)
Pt called wanting to discuss with RN the possibilities of increasing her venlafaxine XR (EFFEXOR XR) 75 MG 24 hr capsule dosage. Please advise.

## 2022-09-04 NOTE — Telephone Encounter (Signed)
LVM for pt asking her to call office. Advised I also sent mychart message if she prefers, she can respond back to that

## 2022-09-08 ENCOUNTER — Encounter: Payer: Self-pay | Admitting: Family Medicine

## 2022-10-24 ENCOUNTER — Other Ambulatory Visit (HOSPITAL_COMMUNITY): Payer: Self-pay

## 2022-11-13 NOTE — Progress Notes (Signed)
PATIENT: Candice Oconnor DOB: 1971-05-04  REASON FOR VISIT: follow up HISTORY FROM: patient  Virtual Visit via Telephone Note  I connected with Candice Oconnor on 11/18/22 at  8:15 AM EST by telephone and verified that I am speaking with the correct person using two identifiers.   I discussed the limitations, risks, security and privacy concerns of performing an evaluation and management service by telephone and the availability of in person appointments. I also discussed with the patient that there may be a patient responsible charge related to this service. The patient expressed understanding and agreed to proceed.   History of Present Illness:  11/18/22 ALL (Mychart): Candice Oconnor is a 52 y.o. female here today for follow up for migraines. She continues Amovig, venlafaxine and rizatriptan. She feels that headaches have worsened over the past few months. She is having about 12-15 headache days with most being migrainous. She does not feel rizatriptan is working as well as it used to. She knows increased stressors and sugar are triggers. She is trying to work on a healthier diet. Mood is stable.   She has tried and failed: Ajovy, Amovig, topiramate, Diamox, propranolol (WPW), venlafaxine, rizatriptan, sumatriptan  11/07/21 ALL: Candice Oconnor returns for follow up for migraines. She continues Ajovy and venlafaxine. Rizatriptan works well for abortive therapy. She has about 7 headache days a month and most all are migrainous. She feels that this is significantly improved from her baseline. She knows that stress is most common trigger. CBD oil helped her sleep but not much with headaches. She is feeling well and without concerns, today.   11/01/2020 ALL:  Candice Oconnor is a 52 y.o. female here today for follow up for migraines. She has done very well on Amovig and venlafaxine 63m daily. Unfortunately, insurance will no longer cover Amovig. Last injection was 12/19. She started using CBD  about a month ago and feels that migraines have improved. She has only had 1 headache this month. She feels that it has also helped her sleep better and her arthritis pain was improved. Rizatriptan works for abortive therapy.  She is under more stress recently. [place]. She is concerned about taking a drug test today for a new job due to using CBD oil.    Observations/Objective:  Generalized: Well developed, in no acute distress  Mentation: Alert oriented to time, place, history taking. Follows all commands speech and language fluent   Assessment and Plan:  52y.o. year old female  has a past medical history of Chicken pox, COVID-19, HPV in female, Hyperlipidemia, Migraine, Prediabetes, and WPW (Wolff-Parkinson-White syndrome). here with    ICD-10-CM   1. Chronic migraine without aura without status migrainosus, not intractable  G43.709 venlafaxine XR (EFFEXOR XR) 75 MG 24 hr capsule      Candice Oconnor reports worsening headaches over the past few month. We will switch Amovig to Emgality every 30 days. She will continue venlafaxine 746mdaily. I will switch rizatriptan to sumatriptan. Dosing discussed. Healthy lifestyle habits encouraged. She will follow up with me in 6 months.   No orders of the defined types were placed in this encounter.   Meds ordered this encounter  Medications   Galcanezumab-gnlm (EMGALITY) 120 MG/ML SOAJ    Sig: Inject 120 mg into the skin every 30 (thirty) days.    Dispense:  3 mL    Refill:  3    Order Specific Question:  Supervising Provider    Answer:   Melvenia Beam [id number]   SUMAtriptan (IMITREX) 100 MG tablet    Sig: Take 1 tablet (100 mg total) by mouth once as needed for up to 1 dose for migraine. May repeat in 2 hours if headache persists or recurs.    Dispense:  10 tablet    Refill:  2    Order Specific Question:   Supervising Provider    Answer:   Melvenia Beam [id number]   venlafaxine XR  (EFFEXOR XR) 75 MG 24 hr capsule    Sig: Take 1 capsule (75 mg total) by mouth daily with breakfast.    Dispense:  90 capsule    Refill:  4    Order Specific Question:   Supervising Provider    Answer:   Melvenia Beam [id number]     Follow Up Instructions:  I discussed the assessment and treatment plan with the patient. The patient was provided an opportunity to ask questions and all were answered. The patient agreed with the plan and demonstrated an understanding of the instructions.   The patient was advised to call back or seek an in-person evaluation if the symptoms worsen or if the condition fails to improve as anticipated.  I provided 15 minutes of non-face-to-face time during this encounter. Patient located at their place of residence during [place] visit. Provider is in the office.    Debbora Presto, NP

## 2022-11-18 ENCOUNTER — Encounter: Payer: Self-pay | Admitting: Family Medicine

## 2022-11-18 ENCOUNTER — Telehealth (INDEPENDENT_AMBULATORY_CARE_PROVIDER_SITE_OTHER): Payer: 59 | Admitting: Family Medicine

## 2022-11-18 DIAGNOSIS — G43709 Chronic migraine without aura, not intractable, without status migrainosus: Secondary | ICD-10-CM

## 2022-11-18 MED ORDER — EMGALITY 120 MG/ML ~~LOC~~ SOAJ: 120.0000 mg | SUBCUTANEOUS | 3 refills | Status: DC

## 2022-11-18 MED ORDER — VENLAFAXINE HCL ER 75 MG PO CP24: 75.0000 mg | ORAL_CAPSULE | Freq: Every day | ORAL | 4 refills | Status: DC

## 2022-11-18 MED ORDER — SUMATRIPTAN SUCCINATE 100 MG PO TABS: 100.0000 mg | ORAL_TABLET | Freq: Once | ORAL | 2 refills | Status: DC | PRN

## 2022-11-18 NOTE — Patient Instructions (Signed)
Below is our plan:  We will continue venlafaxine. I will switch Amovig to [employer]. Continue injection every 30 days. I will switch rizatriptan to sumatriptan. Please take 1 tablet at onset of headache. May take 1 additional tablet in 2 hours if needed. Do not take more than 2 tablets in 24 hours or more than 10 in a month.   Please make sure you are staying well hydrated. I recommend 50-60 ounces daily. Well balanced diet and regular exercise encouraged. Consistent sleep schedule with 6-8 hours recommended.   Please continue follow up with care team as directed.   Follow up with me in 6 months   You may receive a survey regarding today's visit. I encourage you to leave honest feed back as I do use this information to improve patient care. Thank you for seeing me today!

## 2022-11-20 ENCOUNTER — Encounter: Payer: Self-pay | Admitting: Family Medicine

## 2022-11-24 NOTE — Telephone Encounter (Signed)
Submitted PA Emgality on covermymeds. KeyDI:[id number]. Waiting on determination from [employer].

## 2023-02-24 ENCOUNTER — Other Ambulatory Visit: Payer: Self-pay | Admitting: *Deleted

## 2023-02-24 MED ORDER — SUMATRIPTAN SUCCINATE 100 MG PO TABS: 100.0000 mg | ORAL_TABLET | Freq: Once | ORAL | 2 refills | Status: DC | PRN

## 2023-04-26 ENCOUNTER — Telehealth: Payer: Self-pay

## 2023-04-26 ENCOUNTER — Other Ambulatory Visit (HOSPITAL_COMMUNITY): Payer: Self-pay

## 2023-04-26 NOTE — Telephone Encounter (Signed)
     Received a renewal request via CMM for Emgality-PT has not been seen or assessed since starting [employer] would like to see if the PT is responding to the medication as well as supporting documentation. PT has an appointment for August. Please advise-

## 2023-04-27 NOTE — Telephone Encounter (Signed)
Last note stated we will switch to emgality. Do we have documentation saying its been effective?

## 2023-04-27 NOTE — Telephone Encounter (Signed)
1st attempt: lmtcb

## 2023-04-27 NOTE — Telephone Encounter (Signed)
Appointment has been scheduled.

## 2023-05-05 NOTE — Telephone Encounter (Signed)
I called pt LMVM for her that we are trying to get information on how she did with the emgality.  PA being done and insurance is asking.  How many migraine days and headache days in months time.  Please call or respond back in mychart.

## 2023-05-05 NOTE — Telephone Encounter (Signed)
I see appointment has been schedule-Has PT had improvement with Emgality and if so please document how many migraine days, Headache days, Duration of Migraines for insurance purposes-if we can find this out then I can proceed with the PA unless provider would like to wait until after the PT upcoming appointment in August-Please advise

## 2023-05-06 NOTE — Telephone Encounter (Signed)
Pharmacy Patient Advocate Encounter   Received notification from CoverMyMeds that prior authorization for Emgality 120MG /ML auto-injectors (migraine) is required/requested.   Insurance verification completed.   The patient is insured through [facility] .   Per test claim: PA required; PA submitted to [facility] via CoverMyMeds Key/confirmation #/EOC  [id number] Status is pending

## 2023-05-06 NOTE — Telephone Encounter (Signed)
I called pt and she relayed that she has had benefit of decreased severity and frequency of migraine headaches.

## 2023-05-07 ENCOUNTER — Other Ambulatory Visit (HOSPITAL_COMMUNITY): Payer: Self-pay

## 2023-05-07 NOTE — Telephone Encounter (Signed)
Pharmacy Patient Advocate Encounter  Received notification from [facility] that Prior Authorization for Emgality 120MG /ML auto-injectors (migraine) has been APPROVED from 05/06/2023 to 05/05/2024. Ran test claim, Copay is $35.00 per 30DS  PA #/Case ID/Reference #:  [id number]

## 2023-05-19 NOTE — Patient Instructions (Signed)
Below is our plan:  We will continue Emgality every 30 days, venlafaxine 75mg  daily and sumatriptan as needed.   Please make sure you are staying well hydrated. I recommend 50-60 ounces daily. Well balanced diet and regular exercise encouraged. Consistent sleep schedule with 6-8 hours recommended.   Please continue follow up with care team as directed.   Follow up with me in 1 year   You may receive a survey regarding today's visit. I encourage you to leave honest feed back as I do use this information to improve patient care. Thank you for seeing me today!   GENERAL HEADACHE INFORMATION:   Natural supplements: Magnesium Oxide or Magnesium Glycinate 500 mg at bed (up to 800 mg daily) Coenzyme Q10 300 mg in AM Vitamin B2- 200 mg twice a day   Add 1 supplement at a time since even natural supplements can have undesirable side effects. You can sometimes buy supplements cheaper (especially Coenzyme Q10) at www.WebmailGuide.co.za or at [facility].  Migraine with aura: There is increased risk for stroke in women with migraine with aura and a contraindication for the combined contraceptive pill for use by women who have migraine with aura. The risk for women with migraine without aura is lower. However other risk factors like smoking are far more likely to increase stroke risk than migraine. There is a recommendation for no smoking and for the use of OCPs without estrogen such as progestogen only pills particularly for women with migraine with aura.Marland Kitchen People who have migraine headaches with auras may be 3 times more likely to have a stroke caused by a blood clot, compared to migraine patients who don't see auras. Women who take hormone-replacement therapy may be 30 percent more likely to suffer a clot-based stroke than women not taking medication containing estrogen. Other risk factors like smoking and high blood pressure may be  much more important.    Vitamins and herbs that show potential:   Magnesium:  Magnesium (250 mg twice a day or 500 mg at bed) has a relaxant effect on smooth muscles such as blood vessels. Individuals suffering from frequent or daily headache usually have low magnesium levels which can be increase with daily supplementation of 400-750 mg. Three trials found 40-90% average headache reduction  when used as a preventative. Magnesium may help with headaches are aura, the best evidence for magnesium is for migraine with aura is its thought to stop the cortical spreading depression we believe is the pathophysiology of migraine aura.Magnesium also demonstrated the benefit in menstrually related migraine.  Magnesium is part of the messenger system in the serotonin cascade and it is a good muscle relaxant.  It is also useful for constipation which can be a side effect of other medications used to treat migraine. Good sources include nuts, whole grains, and tomatoes. Side Effects: loose stool/diarrhea  Riboflavin (vitamin B 2) 200 mg twice a day. This vitamin assists nerve cells in the production of ATP a principal energy storing molecule.  It is necessary for many chemical reactions in the body.  There have been at least 3 clinical trials of riboflavin using 400 mg per day all of which suggested that migraine frequency can be decreased.  All 3 trials showed significant improvement in over half of migraine sufferers.  The supplement is found in bread, cereal, milk, meat, and poultry.  Most Americans get more riboflavin than the recommended daily allowance, however riboflavin deficiency is not necessary for the supplements to help prevent headache. Side effects: energizing,  green urine   Coenzyme Q10: This is present in almost all cells in the body and is critical component for the conversion of energy.  Recent studies have shown that a nutritional supplement of CoQ10 can reduce the frequency of migraine attacks by improving the energy production of cells as with riboflavin.  Doses of 150 mg twice a  day have been shown to be effective.   Melatonin: Increasing evidence shows correlation between melatonin secretion and headache conditions.  Melatonin supplementation has decreased headache intensity and duration.  It is widely used as a sleep aid.  Sleep is natures way of dealing with migraine.  A dose of 3 mg is recommended to start for headaches including cluster headache. Higher doses up to 15 mg has been reviewed for use in Cluster headache and have been used. The rationale behind using melatonin for cluster is that many theories regarding the cause of Cluster headache center around the disruption of the normal circadian rhythm in the brain.  This helps restore the normal circadian rhythm.   HEADACHE DIET: Foods and beverages which may trigger migraine Note that only 20% of headache patients are food sensitive. You will know if you are food sensitive if you get a headache consistently 20 minutes to 2 hours after eating a certain food. Only cut out a food if it causes headaches, otherwise you might remove foods you enjoy! What matters most for diet is to eat a well balanced healthy diet full of vegetables and low fat protein, and to not miss meals.   Chocolate, other sweets ALL cheeses except cottage and cream cheese Dairy products, yogurt, sour cream, ice cream Liver Meat extracts (Bovril, Marmite, meat tenderizers) Meats or fish which have undergone aging, fermenting, pickling or smoking. These include: Hotdogs,salami,Lox,sausage, mortadellas,smoked salmon, pepperoni, Pickled herring Pods of broad bean (English beans, Chinese pea pods, [place] (fava) beans, lima and navy beans Ripe avocado, ripe banana Yeast extracts or active yeast preparations such as Brewer's or Fleishman's (commercial bakes goods are permitted) Tomato based foods, pizza (lasagna, etc.)   MSG (monosodium glutamate) is disguised as many things; look for these common aliases: Monopotassium glutamate Autolysed  yeast Hydrolysed protein Sodium caseinate "flavorings" "all natural preservatives" Nutrasweet   Avoid all other foods that convincingly provoke headaches.   Resources: The Dizzy [place]air Laundry Your Headache Diet, migrainestrong.com  https://zamora-andrews.com/   Caffeine and Migraine For patients that have migraine, caffeine intake more than 3 days per week can lead to dependency and increased migraine frequency. I would recommend cutting back on your caffeine intake as best you can. The recommended amount of caffeine is 200-300 mg daily, although migraine patients may experience dependency at even lower doses. While you may notice an increase in headache temporarily, cutting back will be helpful for headaches in the long run. For more information on caffeine and migraine, visit: https://americanmigrainefoundation.org/resource-library/caffeine-and-migraine/   Headache Prevention Strategies:   1. Maintain a headache diary; learn to identify and avoid triggers.  - This can be a simple note where you log when you had a headache, associated symptoms, and medications used - There are several smartphone apps developed to help track migraines: Migraine Buddy, Migraine Monitor, Curelator N1-Headache App   Common triggers include: Emotional triggers: Emotional/Upset family or friends Emotional/Upset occupation Business reversal/success Anticipation anxiety Crisis-serious Post-crisis periodNew job/position   Physical triggers: Vacation Day Weekend Strenuous Exercise High Altitude Location New Move Menstrual Day Physical Illness Oversleep/Not enough sleep Weather changes Light: Photophobia or light sesnitivity treatment involves a balance  between desensitization and reduction in overly strong input. Use dark polarized glasses outside, but not inside. Avoid bright or fluorescent light, but do not dim environment to the point that going into a  normally lit room hurts. Consider FL-41 tint lenses, which reduce the most irritating wavelengths without blocking too much light.  These can be obtained at axonoptics.com or theraspecs.com Foods: see list above.   2. Limit use of acute treatments (over-the-counter medications, triptans, etc.) to no more than 2 days per week or 10 days per month to prevent medication overuse headache (rebound headache).     3. Follow a regular schedule (including weekends and holidays): Don't skip meals. Eat a balanced diet. 8 hours of sleep nightly. Minimize stress. Exercise 30 minutes per day. Being overweight is associated with a 5 times increased risk of chronic migraine. Keep well hydrated and drink 6-8 glasses of water per day.   4. Initiate non-pharmacologic measures at the earliest onset of your headache. Rest and quiet environment. Relax and reduce stress. Breathe2Relax is a free app that can instruct you on    some simple relaxtion and breathing techniques. Http://Dawnbuse.com is a    free website that provides teaching videos on relaxation.  Also, there are  many apps that   can be downloaded for "mindful" relaxation.  An app called YOGA NIDRA will help walk you through mindfulness. Another app called Calm can be downloaded to give you a structured mindfulness guide with daily reminders and skill development. Headspace for guided meditation Mindfulness Based Stress Reduction Online Course: www.palousemindfulness.com Cold compresses.   5. Don't wait!! Take the maximum allowable dosage of prescribed medication at the first sign of migraine.   6. Compliance:  Take prescribed medication regularly as directed and at the first sign of a migraine.   7. Communicate:  Call your physician when problems arise, especially if your headaches change, increase in frequency/severity, or become associated with neurological symptoms (weakness, numbness, slurred speech, etc.). Proceed to emergency room if you experience  new or worsening symptoms or symptoms do not resolve, if you have new neurologic symptoms or if headache is severe, or for any concerning symptom.   8. Headache/pain management therapies: Consider various complementary methods, including medication, behavioral therapy, psychological counselling, biofeedback, massage therapy, acupuncture, dry needling, and other modalities.  Such measures may reduce the need for medications. Counseling for pain management, where patients learn to function and ignore/minimize their pain, seems to work very well.   9. Recommend changing family's attention and focus away from patient's headaches. Instead, emphasize daily activities. If first question of day is 'How are your headaches/Do you have a headache today?', then patient will constantly think about headaches, thus making them worse. Goal is to re-direct attention away from headaches, toward daily activities and other distractions.   10. Helpful Websites: www.AmericanHeadacheSociety.org PatentHood.ch www.headaches.org TightMarket.nl www.achenet.org

## 2023-05-19 NOTE — Progress Notes (Signed)
PATIENT: Candice Oconnor DOB: Jan 24, 1971  REASON FOR VISIT: follow up HISTORY FROM: patient  Virtual Visit via Telephone Note  I connected with Candice Oconnor on 05/26/23 at  9:45 AM EDT by telephone and verified that I am speaking with the correct person using two identifiers.   I discussed the limitations, risks, security and privacy concerns of performing an evaluation and management service by telephone and the availability of in person appointments. I also discussed with the patient that there may be a patient responsible charge related to this service. The patient expressed understanding and agreed to proceed.   History of Present Illness:  05/26/23 ALL (Mychart): Candice Oconnor returns for follow up. She was last seen 11/2022 and reported worsening migraines. We switched her from Amovig to [facility] and switched rizatriptan to sumatriptan. Venlafaxine 75mg  daily continued. Since, she reports doing very well. She may have 1-2 headache days a month. Sumatriptan works well for abortive therapy. She is followed annually by PCP.   11/18/2022 ALL (Mychart): Candice Oconnor is a 52 y.o. female here today for follow up for migraines. She continues Amovig, venlafaxine and rizatriptan. She feels that headaches have worsened over the past few months. She is having about 12-15 headache days with most being migrainous. She does not feel rizatriptan is working as well as it used to. She knows increased stressors and sugar are triggers. She is trying to work on a healthier diet. Mood is stable.   She has tried and failed: Ajovy, Amovig, topiramate, Diamox, propranolol (WPW), venlafaxine, rizatriptan, sumatriptan  11/07/21 ALL: Candice Oconnor returns for follow up for migraines. She continues Ajovy and venlafaxine. Rizatriptan works well for abortive therapy. She has about 7 headache days a month and most all are migrainous. She feels that this is significantly improved from her baseline. She knows that stress is  most common trigger. CBD oil helped her sleep but not much with headaches. She is feeling well and without concerns, today.   11/01/2020 ALL:  Candice Oconnor is a 52 y.o. female here today for follow up for migraines. She has done very well on Amovig and venlafaxine 75mg  daily. Unfortunately, insurance will no longer cover Amovig. Last injection was 12/19. She started using CBD about a month ago and feels that migraines have improved. She has only had 1 headache this month. She feels that it has also helped her sleep better and her arthritis pain was improved. Rizatriptan works for abortive therapy.  She is under more stress recently. Her job is moving to [place] and she does not want to commute. She is concerned about taking a drug test today for a new job due to using CBD oil.    Observations/Objective:  Generalized: Well developed, in no acute distress  Mentation: Alert oriented to time, place, history taking. Follows all commands speech and language fluent   Assessment and Plan:  52 y.o. year old female  has a past medical history of Chicken pox, COVID-19, HPV in female, Hyperlipidemia, Migraine, Prediabetes, and WPW (Wolff-Parkinson-White syndrome). here with    ICD-10-CM   1. Chronic migraine without aura without status migrainosus, not intractable  G43.709 venlafaxine XR (EFFEXOR XR) 75 MG 24 hr capsule     Candice Oconnor reports headaches are very well managed. We will continue Emgality every 30 days and venlafaxine 75mg  daily. She will continue sumatriptan as needed. Healthy lifestyle habits encouraged. She will follow up with me in 1 year.    No orders of the defined types were  placed in this encounter.   Meds ordered this encounter  Medications   Galcanezumab-gnlm (EMGALITY) 120 MG/ML SOAJ    Sig: Inject 120 mg into the skin every 30 (thirty) days.    Dispense:  3 mL    Refill:  3    Order Specific Question:   Supervising Provider    Answer:   Anson Fret [[id number]]    SUMAtriptan (IMITREX) 100 MG tablet    Sig: Take 1 tablet (100 mg total) by mouth once as needed for up to 1 dose for migraine. May repeat in 2 hours if headache persists or recurs.    Dispense:  10 tablet    Refill:  11    10 tabs per 30 days    Order Specific Question:   Supervising Provider    Answer:   Anson Fret [[id number]]   venlafaxine XR (EFFEXOR XR) 75 MG 24 hr capsule    Sig: Take 1 capsule (75 mg total) by mouth daily with breakfast.    Dispense:  90 capsule    Refill:  4    Order Specific Question:   Supervising Provider    Answer:   Anson Fret [[id number]]     Follow Up Instructions:  I discussed the assessment and treatment plan with the patient. The patient was provided an opportunity to ask questions and all were answered. The patient agreed with the plan and demonstrated an understanding of the instructions.   The patient was advised to call back or seek an in-person evaluation if the symptoms worsen or if the condition fails to improve as anticipated.  I provided 15 minutes of non-face-to-face time during this encounter. Patient located at their place of residence during Mychart visit. Provider is in the office.    Shawnie Dapper, NP

## 2023-05-26 ENCOUNTER — Encounter: Payer: Self-pay | Admitting: Family Medicine

## 2023-05-26 ENCOUNTER — Telehealth (INDEPENDENT_AMBULATORY_CARE_PROVIDER_SITE_OTHER): Payer: 59 | Admitting: Family Medicine

## 2023-05-26 DIAGNOSIS — G43709 Chronic migraine without aura, not intractable, without status migrainosus: Secondary | ICD-10-CM

## 2023-05-26 MED ORDER — EMGALITY 120 MG/ML ~~LOC~~ SOAJ: 120.0000 mg | SUBCUTANEOUS | 3 refills | Status: AC

## 2023-05-26 MED ORDER — SUMATRIPTAN SUCCINATE 100 MG PO TABS: 100.0000 mg | ORAL_TABLET | Freq: Once | ORAL | 11 refills | Status: DC | PRN

## 2023-05-26 MED ORDER — VENLAFAXINE HCL ER 75 MG PO CP24: 75.0000 mg | ORAL_CAPSULE | Freq: Every day | ORAL | 4 refills | Status: DC

## 2024-05-03 ENCOUNTER — Other Ambulatory Visit (HOSPITAL_COMMUNITY): Payer: Self-pay

## 2024-05-31 ENCOUNTER — Telehealth: Payer: 59 | Admitting: Family Medicine

## 2024-07-25 NOTE — Patient Instructions (Incomplete)

## 2024-07-25 NOTE — Progress Notes (Unsigned)
 No chief complaint on file.    HISTORY OF PRESENT ILLNESS:  07/25/24 ALL: Candice Oconnor returns for follow up for migraines. She was last seen 05/2023 and doing well on Emgality  and sumatriptan .   05/26/23 ALL (Mychart): Candice Oconnor returns for follow up. She was last seen 11/2022 and reported worsening migraines. We switched her from Amovig to Emgality  and switched rizatriptan  to sumatriptan . Venlafaxine  75mg  daily continued. Since, she reports doing very well. She may have 1-2 headache days a month. Sumatriptan  works well for abortive therapy. She is followed annually by PCP.   11/18/2022 ALL (Mychart): Candice Oconnor is a 53 y.o. female here today for follow up for migraines. She continues Amovig, venlafaxine  and rizatriptan . She feels that headaches have worsened over the past few months. She is having about 12-15 headache days with most being migrainous. She does not feel rizatriptan  is working as well as it used to. She knows increased stressors and sugar are triggers. She is trying to work on a healthier diet. Mood is stable.   She has tried and failed: Ajovy , Amovig, topiramate , Diamox, propranolol  (WPW), venlafaxine , rizatriptan , sumatriptan   11/07/2021 ALL:  Candice Oconnor returns for follow up for migraines. She continues Ajovy  and venlafaxine . Rizatriptan  works well for abortive therapy. She has about 7 headache days a month and most all are migrainous. She feels that this is significantly improved from her baseline. She knows that stress is most common trigger. CBD oil helped her sleep but not much with headaches. She is feeling well and without concerns, today.   11/01/2020 ALL:  Candice Oconnor is a 53 y.o. female here today for follow up for migraines. She has done very well on Amovig and venlafaxine  75mg  daily. Unfortunately, insurance will no longer cover Amovig. Last injection was 12/19. She started using CBD about a month ago and feels that migraines have improved. She has only had 1  headache this month. She feels that it has also helped her sleep better and her arthritis pain was improved. Rizatriptan  works for abortive therapy.  She is under more stress recently. Her job is moving to [place] and she does not want to commute. She is concerned about taking a drug test today for a new job due to using CBD oil.   HISTORY (copied from Dr Sharion previous note)  Interval history 11/02/2019: Patient her for follow up of migraine.  We have tried multiple medications with patient such as propranolol , Effexor  and others in the past and she is doing extremely well currently on Aimovig .  She was late picking up the Aimovig  and had a migraine otherwise doing very well.  She is tried [place] in the past, did not help, Maxalt  does work acutely. She was recently diagnosed with WPW and pre-diabetes. She has had more depression and stress over the last year, we discussed increasing her effexor  but I also recommend seeing a therapist and a psychiatrist.   Interval history 10/27/2018: She returns today for follow up. She is doing great with Amovig. She is having 1-2 migraines a month, usually the week prior to Amovig dose being due. She is using rizatriptan  for abortive therapy but is concerned about side effects. She continues propranolol  daily. She is tolerating medications well.    Interval history 12/09/2017: Extensive workup and LP normal. MRi brain normal (reviewed images with patient and answered all questions), opening pressure was 16 on LP. She was started on propranolol  and Topiramate  ER (qudexy ). She is doing well. She is on 40,g  propranolol  and Quedexy 25mg . She is having side effects to Qudexy . Stop the Quedexy due to side effects.  Since last Thursday feels better on the propranolol . Discussed stopping Topiramate , will add on Ajovy .   HPI:  Candice Oconnor is a 53 y.o. female here as a referral from Dr. No ref. provider found for migraines. PMHx migraine.  Headaches started in her early 53s,  were triggered by stress and poor eating. She would have them occasionally. For the past 3 months, headaches have been more frequent and now she has daily headaches. Imitrex  doesn;t help. On the 21st, eye pain woke her up in the middle of the night. She had an MRI brain, she saw her eye doctor, she saw ophthalmology and also her primary care. MRI brain was negative. Her migraines are in the frontal area and on the top of her head on left, light sensitivity, nausea, pulsating like a heart beat and sharp pains, constant. A wet cloth and laying in the dark help, they can last 24 hours. She has other headaches, more pressure on the right side of the head, constant dull, she has mild continuous light sensitivity and some mild nausea. Can wax and wane but it has been continuous since the 21st and right eye pain. She has tried imitrex , ibuprofen , she was started on propranolol  and is feeling better. Sister has migraines. No medication overuse. She is aware of rebound headaches. Vision is worsening but she denies diplopia, vision is blurry, she feels like her ears are full, lots of pressure, No other focal neurologic deficits, associated symptoms, inciting events or modifiable factors.   Reviewed notes, labs and imaging from outside physicians, which showed:   Personally reviewed MRI images, normal   Cbc/cmp normal    REVIEW OF SYSTEMS: Out of a complete 14 system review of symptoms, the patient complains only of the following symptoms, headaches and all other reviewed systems are negative.   ALLERGIES: Allergies  Allergen Reactions   Cayenne Anaphylaxis and Swelling    THROAT SWELLS (Cayenne pepper mix)   Propranolol      Chest pain assoc with WPWS and propranolol     Tetanus Toxoid Nausea And Vomiting and Other (See Comments)    Patient vomited and passed out after shot, 20 years ago   Tetanus Toxoid-Containing Vaccines Nausea And Vomiting and Other (See Comments)    Patient vomited and passed out  after shot, 20 years ago     HOME MEDICATIONS: Outpatient Medications Prior to Visit  Medication Sig Dispense Refill   albuterol  (VENTOLIN  HFA) 108 (90 Base) MCG/ACT inhaler Inhale 2 puffs into the lungs every 6 (six) hours as needed for wheezing or shortness of breath. 1 Inhaler 0   Galcanezumab -gnlm (EMGALITY ) 120 MG/ML SOAJ Inject 120 mg into the skin every 30 (thirty) days. 3 mL 3   montelukast (SINGULAIR) 10 MG tablet Take 10 mg by mouth at bedtime as needed (for seasonal flares).      ondansetron  (ZOFRAN -ODT) 4 MG disintegrating tablet Take 1 tablet (4 mg total) by mouth every 8 (eight) hours as needed for nausea or vomiting. 8 tablet 0   pramipexole (MIRAPEX) 0.5 MG tablet Take 0.5 mg by mouth as needed.     SUMAtriptan  (IMITREX ) 100 MG tablet Take 1 tablet (100 mg total) by mouth once as needed for up to 1 dose for migraine. May repeat in 2 hours if headache persists or recurs. 10 tablet 11   venlafaxine  XR (EFFEXOR  XR) 75 MG 24 hr capsule Take  1 capsule (75 mg total) by mouth daily with breakfast. 90 capsule 4   No facility-administered medications prior to visit.     PAST MEDICAL HISTORY: Past Medical History:  Diagnosis Date   Chicken pox    COVID-19    HPV in female    Hyperlipidemia    Migraine    Prediabetes    WPW (Wolff-Parkinson-White syndrome)      PAST SURGICAL HISTORY: Past Surgical History:  Procedure Laterality Date   EPIDURAL BLOOD PATCH  11/16/2017   LUMBAR PUNCTURE  11/12/2017     FAMILY HISTORY: Family History  Problem Relation Age of Onset   Heart disease Mother    Diabetes Mother    Heart disease Father    Diabetes Father    Breast cancer Neg Hx      SOCIAL HISTORY: Social History   Socioeconomic History   Marital status: Divorced    Spouse name: Not on file   Number of children: 1   Years of education: Not on file   Highest education level: Associate degree: occupational, Scientist, product/process development, or vocational program  Occupational History    Not on file  Tobacco Use   Smoking status: Never   Smokeless tobacco: Never  Vaping Use   Vaping status: Never Used  Substance and Sexual Activity   Alcohol use: Yes    Comment: occ   Drug use: No    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    Sexual activity: Not Currently    Partners: Male    Birth control/protection: Abstinence  Other Topics Concern   Not on file  Social History Narrative   Lives at home with her child   Right handed   Drinks 1 cup of caffeine daily   Social Drivers of Corporate investment banker Strain: Not on file  Food Insecurity: Not on file  Transportation Needs: Not on file  Physical Activity: Not on file  Stress: Not on file  Social Connections: Unknown (02/15/2022)   [facility]   Social Network    Social Network: Not on file  Intimate Partner Violence: Unknown (01/08/2022)   Received from [facility]   HITS    Physically Hurt: Not on file    Insult or Talk Down To: Not on file    Threaten Physical Harm: Not on file    Scream or Curse: Not on file      PHYSICAL EXAM  There were no vitals filed for this visit.   There is no height or weight on file to calculate BMI.   Generalized: Well developed, in no acute distress  Cardiology: normal rate and rhythm, no murmur auscultated  Respiratory: clear to auscultation bilaterally    Neurological examination  Mentation: Alert oriented to time, place, history taking. Follows all commands speech and language fluent Cranial nerve II-XII: Pupils were equal round reactive to light. Extraocular movements were full, visual field were full on confrontational test. Facial sensation and strength were normal. Head turning and shoulder shrug  were normal and symmetric. Motor: The motor testing reveals 5 over 5 strength of all 4 extremities. Good symmetric motor tone is noted throughout.   Gait and station: Gait is normal.     DIAGNOSTIC DATA (LABS, IMAGING, TESTING) - I  reviewed patient records, labs, notes, testing and imaging myself where available.  Lab Results  Component Value Date   WBC 11.7 (H) 07/26/2021   HGB 11.8 (L) 07/26/2021   HCT 36.5 07/26/2021   MCV 80.0  07/26/2021   PLT 337 07/26/2021      Component Value Date/Time   NA 137 07/26/2021 1808   K 3.7 07/26/2021 1808   CL 104 07/26/2021 1808   CO2 22 07/26/2021 1808   GLUCOSE 115 (H) 07/26/2021 1808   BUN 13 07/26/2021 1808   CREATININE 0.95 07/26/2021 1808   CALCIUM 9.1 07/26/2021 1808   PROT 7.8 01/29/2019 0759   ALBUMIN 3.8 01/29/2019 0759   AST 25 01/29/2019 0759   ALT 21 01/29/2019 0759   ALKPHOS 62 01/29/2019 0759   BILITOT 0.4 01/29/2019 0759   GFRNONAA >60 07/26/2021 1808   GFRAA >60 01/29/2019 0759   No results found for: CHOL, HDL, LDLCALC, LDLDIRECT, TRIG, CHOLHDL No results found for: YHAJ8R No results found for: VITAMINB12 No results found for: TSH      No data to display           ASSESSMENT AND PLAN  53 y.o. year old female  has a past medical history of Chicken pox, COVID-19, HPV in female, Hyperlipidemia, Migraine, Prediabetes, and WPW (Wolff-Parkinson-White syndrome). here with   No diagnosis found.   Candice Oconnor is doing very well, today.  Migraines remain well managed on venlafaxine  75 mg daily and Ajovy . She will continue rizatriptan  for abortive therapy. May add Tylenol  and or ibuprofen  if needed. Advised against regular use. Healthy lifestyle habits encouraged.  She will follow-up with me in 1 year, sooner if needed.  She verbalizes understanding and agreement with this plan.    No orders of the defined types were placed in this encounter.      Greig Forbes, MSN, FNP-C 07/25/2024, 8:15 AM  [facility] [address], [address] [place], [place] [address] [phone]

## 2024-07-26 ENCOUNTER — Ambulatory Visit (INDEPENDENT_AMBULATORY_CARE_PROVIDER_SITE_OTHER): Payer: Self-pay | Admitting: Family Medicine

## 2024-07-26 ENCOUNTER — Encounter: Payer: Self-pay | Admitting: Family Medicine

## 2024-07-26 VITALS — BP 120/79 | HR 76 | Ht 63.0 in | Wt 235.5 lb

## 2024-07-26 DIAGNOSIS — F330 Major depressive disorder, recurrent, mild: Secondary | ICD-10-CM

## 2024-07-26 DIAGNOSIS — G43709 Chronic migraine without aura, not intractable, without status migrainosus: Secondary | ICD-10-CM | POA: Diagnosis not present

## 2024-07-26 MED ORDER — VENLAFAXINE HCL ER 150 MG PO CP24: 150.0000 mg | ORAL_CAPSULE | Freq: Every day | ORAL | 1 refills | Status: AC

## 2024-07-26 MED ORDER — SUMATRIPTAN SUCCINATE 100 MG PO TABS: 100.0000 mg | ORAL_TABLET | Freq: Once | ORAL | 11 refills | Status: AC | PRN

## 2025-01-30 ENCOUNTER — Telehealth: Admitting: Family Medicine
